# Patient Record
Sex: Male | Born: 1950
Health system: Southern US, Community
[De-identification: ages and names within clinical notes are randomized; demographics above are authoritative.]

## PROBLEM LIST (undated history)

## (undated) DIAGNOSIS — K219 Gastro-esophageal reflux disease without esophagitis: Secondary | ICD-10-CM

## (undated) DIAGNOSIS — I739 Peripheral vascular disease, unspecified: Secondary | ICD-10-CM

## (undated) DIAGNOSIS — I10 Essential (primary) hypertension: Secondary | ICD-10-CM

## (undated) HISTORY — PX: ORIF TIBIA & FIBULA FRACTURES: SHX2131

## (undated) HISTORY — PX: WISDOM TOOTH EXTRACTION: SHX21

---

## 2005-12-12 ENCOUNTER — Ambulatory Visit: Payer: Self-pay | Admitting: Gastroenterology

## 2005-12-12 LAB — HM COLONOSCOPY

## 2012-04-25 ENCOUNTER — Ambulatory Visit: Payer: Self-pay | Admitting: Family Medicine

## 2012-04-30 ENCOUNTER — Ambulatory Visit: Payer: Self-pay | Admitting: Family Medicine

## 2012-05-07 ENCOUNTER — Ambulatory Visit: Payer: Self-pay | Admitting: Family Medicine

## 2012-06-03 ENCOUNTER — Ambulatory Visit: Payer: Self-pay | Admitting: Family Medicine

## 2012-07-11 ENCOUNTER — Ambulatory Visit: Payer: Self-pay | Admitting: Family Medicine

## 2013-02-13 DIAGNOSIS — I82409 Acute embolism and thrombosis of unspecified deep veins of unspecified lower extremity: Secondary | ICD-10-CM

## 2013-02-13 DIAGNOSIS — I739 Peripheral vascular disease, unspecified: Secondary | ICD-10-CM

## 2013-02-13 HISTORY — DX: Acute embolism and thrombosis of unspecified deep veins of unspecified lower extremity: I82.409

## 2013-02-13 HISTORY — DX: Peripheral vascular disease, unspecified: I73.9

## 2013-08-11 LAB — LIPID PANEL
Cholesterol: 199 mg/dL (ref 0–200)
HDL: 43 mg/dL (ref 35–70)
LDL Cholesterol: 123 mg/dL
LDl/HDL Ratio: 2.9
Triglycerides: 163 mg/dL — AB (ref 40–160)

## 2013-08-11 LAB — BASIC METABOLIC PANEL
BUN: 23 mg/dL — AB (ref 4–21)
Creatinine: 1 mg/dL (ref <=1.3)
Glucose: 89 mg/dL
Potassium: 4.6 mmol/L (ref 3.4–5.3)
Sodium: 141 mmol/L (ref 137–147)

## 2013-08-11 LAB — CBC AND DIFFERENTIAL
HCT: 40 % — AB (ref 41–53)
Hemoglobin: 14.1 g/dL (ref 13.5–17.5)
Neutrophils Absolute: 2 /uL
Platelets: 275 10*3/uL (ref 150–399)
WBC: 4.4 10^3/mL

## 2013-08-11 LAB — HEPATIC FUNCTION PANEL
ALT: 17 U/L (ref 10–40)
AST: 22 U/L (ref 14–40)
Alkaline Phosphatase: 108 U/L (ref 25–125)
Bilirubin, Total: 0.5 mg/dL

## 2013-08-11 LAB — PSA: PSA: 0.7

## 2013-08-11 LAB — TSH: TSH: 0.74 u[IU]/mL (ref <=5.90)

## 2014-06-29 DIAGNOSIS — I82409 Acute embolism and thrombosis of unspecified deep veins of unspecified lower extremity: Secondary | ICD-10-CM | POA: Insufficient documentation

## 2014-06-29 DIAGNOSIS — L570 Actinic keratosis: Secondary | ICD-10-CM | POA: Insufficient documentation

## 2014-06-29 DIAGNOSIS — K219 Gastro-esophageal reflux disease without esophagitis: Secondary | ICD-10-CM | POA: Insufficient documentation

## 2014-06-29 DIAGNOSIS — J309 Allergic rhinitis, unspecified: Secondary | ICD-10-CM | POA: Insufficient documentation

## 2014-06-29 DIAGNOSIS — IMO0002 Reserved for concepts with insufficient information to code with codable children: Secondary | ICD-10-CM | POA: Insufficient documentation

## 2014-06-29 DIAGNOSIS — E785 Hyperlipidemia, unspecified: Secondary | ICD-10-CM | POA: Insufficient documentation

## 2014-06-29 DIAGNOSIS — M9979 Connective tissue and disc stenosis of intervertebral foramina of abdomen and other regions: Secondary | ICD-10-CM | POA: Insufficient documentation

## 2014-06-29 DIAGNOSIS — Z86718 Personal history of other venous thrombosis and embolism: Secondary | ICD-10-CM | POA: Insufficient documentation

## 2014-06-29 DIAGNOSIS — I10 Essential (primary) hypertension: Secondary | ICD-10-CM | POA: Insufficient documentation

## 2014-08-26 ENCOUNTER — Encounter: Payer: Self-pay | Admitting: Family Medicine

## 2014-08-26 ENCOUNTER — Ambulatory Visit (INDEPENDENT_AMBULATORY_CARE_PROVIDER_SITE_OTHER): Payer: PRIVATE HEALTH INSURANCE | Admitting: Family Medicine

## 2014-08-26 VITALS — BP 120/78 | HR 64 | Temp 98.5°F | Resp 16 | Ht 72.0 in | Wt 194.8 lb

## 2014-08-26 DIAGNOSIS — Z0000 Encounter for general adult medical examination without abnormal findings: Secondary | ICD-10-CM | POA: Diagnosis not present

## 2014-08-26 LAB — POCT URINALYSIS DIPSTICK
Bilirubin, UA: NEGATIVE
Blood, UA: NEGATIVE
Glucose, UA: NEGATIVE
Ketones, UA: NEGATIVE
Leukocytes, UA: NEGATIVE
Nitrite, UA: NEGATIVE
Protein, UA: NEGATIVE
Spec Grav, UA: 1.02
Urobilinogen, UA: 0.2
pH, UA: 6

## 2014-08-26 LAB — IFOBT (OCCULT BLOOD): IFOBT: NEGATIVE

## 2014-08-26 NOTE — Progress Notes (Signed)
Patient ID: Patrick Long, male   DOB: [birth year], 64 y.o.   MRN: [id number]        Patient: Patrick Long, Male    DOB: 1950/07/26, 64 y.o.   MRN: [id number] Visit Date: 08/26/2014  Today's Provider: Wilhemena Durie, MD   Chief Complaint  Patient presents with  . Annual Exam    last annual exam was on 08/11/2013  Labs were done on 08/11/2013 Subjective:    Annual physical exam Patrick Long is a 64 y.o. male who presents today for health maintenance and complete physical. He feels well. He reports exercising regular activities. He reports he is sleeping well.  -----------------------------------------------------------------   Review of Systems  Constitutional: Negative.   HENT: Negative.   Eyes: Negative.   Respiratory: Negative.   Cardiovascular: Negative.   Gastrointestinal: Negative.   Endocrine: Negative.   Genitourinary: Negative.   Musculoskeletal: Negative.   Skin: Negative.   Allergic/Immunologic: Negative.   Neurological: Negative.   Hematological: Negative.   Psychiatric/Behavioral: Negative.     Social History He  reports that he quit smoking about 34 years ago. He does not have any smokeless tobacco history on file. He reports that he drinks alcohol. He reports that he does not use illicit drugs.  Patient Active Problem List   Diagnosis Date Noted  . Actinic keratoses 06/29/2014  . Allergic rhinitis 06/29/2014  . Narrowing of intervertebral disc space 06/29/2014  . Deep vein thrombosis 06/29/2014  . Essential (primary) hypertension 06/29/2014  . Acid reflux 06/29/2014  . HLD (hyperlipidemia) 06/29/2014  . Squamous cell carcinoma 06/29/2014    No past surgical history on file.  Family History His family history includes Arrhythmia in his mother; Asthma in his mother; Dementia in his mother; Glaucoma in his sister; Prostate cancer in his father; Stroke in his father.    No Known Allergies  Previous Medications   AMOXICILLIN-CLAVULANATE (AUGMENTIN) 875-125 MG PER TABLET    Take 1 tablet by mouth 2 (two) times daily. for 10 days   ASPIRIN 325 MG TABLET    Take by mouth.   ESOMEPRAZOLE (NEXIUM) 40 MG CAPSULE    Take by mouth. Monday Wednesday and Friday   LOSARTAN (COZAAR) 100 MG TABLET    Take by mouth daily.    METRONIDAZOLE (METROGEL) 1 % GEL    METROGEL, 1% (External Gel)  as needed for 0 days  Refills: 0   Ordered :09-Jul-2012  Miguel Aschoff MD;  Started 09-Jul-2012 Active Comments: Medication taken as needed.   MULTIPLE VITAMINS-MINERALS (MULTIVITAMIN ADULT PO)    Take by mouth.    Patient Care Team: Jerrol Banana., MD as PCP - General (Family Medicine)     Objective:   Vitals: BP 120/78 mmHg  Pulse 64  Temp(Src) 98.5 F (36.9 C) (Oral)  Resp 16  Ht 6' (1.829 m)  Wt 194 lb 12.8 oz (88.361 kg)  BMI 26.41 kg/m2   Physical Exam  Constitutional: He appears well-developed and well-nourished.  HENT:  Head: Normocephalic and atraumatic.  Right Ear: External ear normal.  Left Ear: External ear normal.  Nose: Nose normal.  Mouth/Throat: Oropharynx is clear and moist.  Eyes: Conjunctivae and EOM are normal. Pupils are equal, round, and reactive to light.  Neck: Normal range of motion. Neck supple.  Cardiovascular: Normal rate, regular rhythm, normal heart sounds and intact distal pulses.   Pulmonary/Chest: Effort normal and breath sounds normal.  Abdominal: Soft. Bowel sounds are normal.  Genitourinary: Rectum normal, prostate normal and penis  normal.  Musculoskeletal: Normal range of motion.  Neurological: He is alert.  Skin: Skin is warm and dry.  Psychiatric: He has a normal mood and affect. His behavior is normal. Judgment and thought content normal.     Depression Screen No flowsheet data found.    Assessment & Plan:     Routine Health Maintenance and Physical Exam  Exercise Activities and Dietary recommendations Goals    None      Immunization History    Administered Date(s) Administered  . Td 11/03/2003  . Tdap 08/05/2013  . Zoster 08/05/2013    Health Maintenance  Topic Date Due  . HIV Screening  01/09/1965  . INFLUENZA VACCINE  09/14/2014  . COLONOSCOPY  12/13/2015  . TETANUS/TDAP  08/06/2023  . ZOSTAVAX  Completed      Discussed health benefits of physical activity, and encouraged him to engage in regular exercise appropriate for his age and condition.    Hypertension  GERD  History of unprovoked DVT-- this is followed clinically and patient wear support hose.   --------------------------------------------------------------------

## 2014-09-03 ENCOUNTER — Telehealth: Payer: Self-pay

## 2014-09-03 LAB — CBC
Hematocrit: 43.6 % (ref 37.5–51.0)
Hemoglobin: 14.7 g/dL (ref 12.6–17.7)
MCH: 30.4 pg (ref 26.6–33.0)
MCHC: 33.7 g/dL (ref 31.5–35.7)
MCV: 90 fL (ref 79–97)
Platelets: 255 10*3/uL (ref 150–379)
RBC: 4.83 x10E6/uL (ref 4.14–5.80)
RDW: 14 % (ref 12.3–15.4)
WBC: 5.2 10*3/uL (ref 3.4–10.8)

## 2014-09-03 LAB — LIPID PANEL WITH LDL/HDL RATIO
Cholesterol, Total: 233 mg/dL — ABNORMAL HIGH (ref 100–199)
HDL: 31 mg/dL — ABNORMAL LOW (ref >39)
Triglycerides: 568 mg/dL (ref 0–149)

## 2014-09-03 LAB — BASIC METABOLIC PANEL
BUN/Creatinine Ratio: 20 (ref 10–22)
BUN: 18 mg/dL (ref 8–27)
CO2: 21 mmol/L (ref 18–29)
Calcium: 9.4 mg/dL (ref 8.6–10.2)
Chloride: 101 mmol/L (ref 97–108)
Creatinine, Ser: 0.91 mg/dL (ref 0.76–1.27)
GFR calc Af Amer: 103 mL/min/{1.73_m2} (ref >59)
GFR calc non Af Amer: 89 mL/min/{1.73_m2} (ref >59)
Glucose: 95 mg/dL (ref 65–99)
Potassium: 5.1 mmol/L (ref 3.5–5.2)
Sodium: 140 mmol/L (ref 134–144)

## 2014-09-03 LAB — PSA: Prostate Specific Ag, Serum: 0.9 ng/mL (ref 0.0–4.0)

## 2014-09-03 NOTE — Telephone Encounter (Signed)
LMTCB  aa 

## 2014-09-03 NOTE — Telephone Encounter (Signed)
Patient states he is allergic to fish, what should he do? Patrick Long

## 2014-09-03 NOTE — Telephone Encounter (Signed)
-----   Message from Jerrol Banana., MD sent at 09/03/2014  7:17 AM EDT ----- Labs stable except for triglycerides higher. Stressed diet and exercise. Add Fish oil 2 daily.

## 2014-09-03 NOTE — Telephone Encounter (Signed)
Low-fat low-cholesterol diet. Regular exercise helps also.

## 2014-09-04 ENCOUNTER — Telehealth: Payer: Self-pay

## 2014-09-04 NOTE — Telephone Encounter (Signed)
Advised pt of instruction to eat a low cholesterol diet and exercise will help. Pt verbalized fully understanding of instructions.

## 2014-09-07 NOTE — Telephone Encounter (Signed)
LMTCB  aa 

## 2014-11-03 ENCOUNTER — Emergency Department: Payer: 59

## 2014-11-03 ENCOUNTER — Encounter: Payer: Self-pay | Admitting: Emergency Medicine

## 2014-11-03 ENCOUNTER — Telehealth: Payer: Self-pay | Admitting: Family Medicine

## 2014-11-03 ENCOUNTER — Telehealth: Payer: Self-pay | Admitting: Cardiovascular Disease

## 2014-11-03 ENCOUNTER — Emergency Department
Admission: EM | Admit: 2014-11-03 | Discharge: 2014-11-03 | Disposition: A | Discharge status: Home / Self Care | Payer: 59 | Attending: Emergency Medicine | Admitting: Emergency Medicine

## 2014-11-03 DIAGNOSIS — R079 Chest pain, unspecified: Secondary | ICD-10-CM | POA: Diagnosis present

## 2014-11-03 DIAGNOSIS — Z7982 Long term (current) use of aspirin: Secondary | ICD-10-CM | POA: Insufficient documentation

## 2014-11-03 DIAGNOSIS — R1010 Upper abdominal pain, unspecified: Secondary | ICD-10-CM | POA: Insufficient documentation

## 2014-11-03 DIAGNOSIS — I10 Essential (primary) hypertension: Secondary | ICD-10-CM | POA: Insufficient documentation

## 2014-11-03 DIAGNOSIS — Z79899 Other long term (current) drug therapy: Secondary | ICD-10-CM | POA: Insufficient documentation

## 2014-11-03 DIAGNOSIS — Z87891 Personal history of nicotine dependence: Secondary | ICD-10-CM | POA: Insufficient documentation

## 2014-11-03 DIAGNOSIS — R109 Unspecified abdominal pain: Secondary | ICD-10-CM

## 2014-11-03 HISTORY — DX: Peripheral vascular disease, unspecified: I73.9

## 2014-11-03 HISTORY — DX: Essential (primary) hypertension: I10

## 2014-11-03 HISTORY — DX: Gastro-esophageal reflux disease without esophagitis: K21.9

## 2014-11-03 LAB — COMPREHENSIVE METABOLIC PANEL
ALT: 15 U/L — ABNORMAL LOW (ref 17–63)
AST: 24 U/L (ref 15–41)
Albumin: 4.4 g/dL (ref 3.5–5.0)
Alkaline Phosphatase: 98 U/L (ref 38–126)
Anion gap: 6 (ref 5–15)
BUN: 20 mg/dL (ref 6–20)
CO2: 28 mmol/L (ref 22–32)
Calcium: 9.4 mg/dL (ref 8.9–10.3)
Chloride: 105 mmol/L (ref 101–111)
Creatinine, Ser: 1.01 mg/dL (ref 0.61–1.24)
GFR calc Af Amer: >60 mL/min (ref >60)
GFR calc non Af Amer: >60 mL/min (ref >60)
Glucose, Bld: 117 mg/dL — ABNORMAL HIGH (ref 65–99)
Potassium: 4.5 mmol/L (ref 3.5–5.1)
Sodium: 139 mmol/L (ref 135–145)
Total Bilirubin: 0.6 mg/dL (ref 0.3–1.2)
Total Protein: 7.6 g/dL (ref 6.5–8.1)

## 2014-11-03 LAB — CBC WITH DIFFERENTIAL/PLATELET
Basophils Absolute: 0 10*3/uL (ref 0–0.1)
Basophils Relative: 0 %
Eosinophils Absolute: 0.1 10*3/uL (ref 0–0.7)
Eosinophils Relative: 1 %
HCT: 45 % (ref 40.0–52.0)
Hemoglobin: 15.1 g/dL (ref 13.0–18.0)
Lymphocytes Relative: 16 %
Lymphs Abs: 1.2 10*3/uL (ref 1.0–3.6)
MCH: 30.7 pg (ref 26.0–34.0)
MCHC: 33.6 g/dL (ref 32.0–36.0)
MCV: 91.6 fL (ref 80.0–100.0)
Monocytes Absolute: 0.4 10*3/uL (ref 0.2–1.0)
Monocytes Relative: 5 %
Neutro Abs: 5.8 10*3/uL (ref 1.4–6.5)
Neutrophils Relative %: 78 %
Platelets: 249 10*3/uL (ref 150–440)
RBC: 4.92 MIL/uL (ref 4.40–5.90)
RDW: 13.2 % (ref 11.5–14.5)
WBC: 7.5 10*3/uL (ref 3.8–10.6)

## 2014-11-03 LAB — LIPASE, BLOOD: Lipase: 32 U/L (ref 22–51)

## 2014-11-03 LAB — TROPONIN I
Troponin I: <0.03 ng/mL (ref <0.031)
Troponin I: <0.03 ng/mL (ref <0.031)

## 2014-11-03 MED ORDER — GI COCKTAIL ~~LOC~~
30.0000 mL | Freq: Once | ORAL | Status: AC
  Administered 2014-11-03: 30 mL via ORAL
  Filled 2014-11-03: qty 30

## 2014-11-03 NOTE — ED Provider Notes (Addendum)
[facility] Emergency Department Provider Note  ____________________________________________  Time seen: Approximately 7:51 AM  I have reviewed the triage vital signs and the nursing notes.   HISTORY  Chief Complaint Chest Pain    HPI Patrick Long is a 64 y.o. male with a history of hypertension and GERD who is presenting today with upper abdominal tightness associated with nausea and one episode of vomiting. He said that he [place] this [place] when he felt tightness around his upper abdomen and into his lower chest. He said that he then became diaphoretic and one episode of vomiting. He said that the pain has decreased since the incident. He is also short of breath at the time. No longer feels nauseous but still is mild pain to the upper abdomen. Denies any chest pain at this time. No pain with deep breathing. Said he did have a superficial venous thrombosis about one year ago which he took aspirin for treatment.Does not note any recent leg swelling or pain. Denies any recent pain after eating. No heart disease in his family but does have a history of stroke and his family.   Past Medical History  Diagnosis Date  . Hypertension   . GERD (gastroesophageal reflux disease)   . Peripheral vascular disease     Patient Active Problem List   Diagnosis Date Noted  . Actinic keratoses 06/29/2014  . Allergic rhinitis 06/29/2014  . Narrowing of intervertebral disc space 06/29/2014  . Deep vein thrombosis 06/29/2014  . Essential (primary) hypertension 06/29/2014  . Acid reflux 06/29/2014  . HLD (hyperlipidemia) 06/29/2014  . Squamous cell carcinoma 06/29/2014    Past Surgical History  Procedure Laterality Date  . Orif tibia & fibula fractures      Current Outpatient Rx  Name  Route  Sig  Dispense  Refill  . aspirin 325 MG tablet   Oral   Take 325 mg by mouth daily.          Marland Kitchen esomeprazole (NEXIUM) 40 MG capsule   Oral   Take 40 mg by mouth  every other day.          . losartan (COZAAR) 100 MG tablet   Oral   Take 100 mg by mouth daily.          . Multiple Vitamins-Minerals (MULTIVITAMIN ADULT PO)   Oral   Take 1 tablet by mouth daily.            Allergies Fish allergy  Family History  Problem Relation Age of Onset  . Asthma Mother   . Arrhythmia Mother   . Dementia Mother   . Prostate cancer Father   . Stroke Father   . Glaucoma Sister     Social History Social History  Substance Use Topics  . Smoking status: Former Smoker -- 10 years    Quit date: 02/14/1980  . Smokeless tobacco: None  . Alcohol Use: 0.0 oz/week    0 Standard drinks or equivalent per week     Comment: occasionally    Review of Systems Constitutional: No fever/chills Eyes: No visual changes. ENT: No sore throat. Cardiovascular: As above  Respiratory: As above Gastrointestinal:  No diarrhea.  No constipation. Genitourinary: Negative for dysuria. Musculoskeletal: Negative for back pain. Skin: Negative for rash. Neurological: Negative for headaches, focal weakness or numbness.  10-point ROS otherwise negative.  ____________________________________________   PHYSICAL EXAM:  VITAL SIGNS: ED Triage Vitals  Enc Vitals Group     BP 11/03/14 0747  179/117 mmHg     Pulse Rate 11/03/14 0747 58     Resp 11/03/14 0747 17     Temp 11/03/14 0747 97.7 F (36.5 C)     Temp Source 11/03/14 0747 Oral     SpO2 11/03/14 0747 100 %     Weight 11/03/14 0747 190 lb (86.183 kg)     Height 11/03/14 0747 6' (1.829 m)     Head Cir --      Peak Flow --      Pain Score 11/03/14 0748 5     Pain Loc --      Pain Edu? --      Excl. in [place]? --     Constitutional: Alert and oriented. Well appearing and in no acute distress. Eyes: Conjunctivae are normal. PERRL. EOMI. Head: Atraumatic. Nose: No congestion/rhinnorhea. Mouth/Throat: Mucous membranes are moist.  Oropharynx non-erythematous. Neck: No stridor.   Cardiovascular: Normal rate,  regular rhythm. Grossly normal heart sounds.  Good peripheral circulation. Respiratory: Normal respiratory effort.  No retractions. Lungs CTAB. Gastrointestinal: Soft with mild to moderate tenderness to the upper abdomen including a positive Murphy sign.. No distention. No abdominal bruits. No CVA tenderness. Musculoskeletal: No lower extremity tenderness nor edema.  No joint effusions. Neurologic:  Normal speech and language. No gross focal neurologic deficits are appreciated. No gait instability. Skin:  Skin is warm, dry and intact. No rash noted. Psychiatric: Mood and affect are normal. Speech and behavior are normal.  ____________________________________________   LABS (all labs ordered are listed, but only abnormal results are displayed)  Labs Reviewed  COMPREHENSIVE METABOLIC PANEL - Abnormal; Notable for the following:    Glucose, Bld 117 (*)    ALT 15 (*)    All other components within normal limits  CBC WITH DIFFERENTIAL/PLATELET  TROPONIN I  LIPASE, BLOOD  TROPONIN I   ____________________________________________  EKG  ED ECG REPORT I, Doran Stabler, the attending physician, personally viewed and interpreted this ECG.   Date: 11/03/2014  EKG Time: 744  Rate: 63  Rhythm: normal EKG, normal sinus rhythm with sinus arrhythmia  Axis: Normal axis  Intervals:none  ST&T Change: No ST segment elevations or depressions. No abnormal T-wave inversions.  ____________________________________________  RADIOLOGY  Normal chest x-ray. I personally reviewed this image.  Normal gallbladder ultrasound. ____________________________________________   PROCEDURES    ____________________________________________   INITIAL IMPRESSION / ASSESSMENT AND PLAN / ED COURSE  Pertinent labs & imaging results that were available during my care of the patient were reviewed by me and considered in my medical decision making (see chart for  details).  ----------------------------------------- 12:25 PM on 11/03/2014 -----------------------------------------  Pain relieved after GI cocktail. Suspect upper abdominal pain secondary to gastric issue with vagal response causing diaphoresis. However, patient does have cardiac risk factors. Discussed case with Dr. Rockey Situ and will see patient in the office. For the patient's contact information to his office staff to be contacted for an urgent follow-up appointment. I discussed the results with the patient as well as his family. He will begin taking his PPI every day instead of every other day. ____________________________________________   FINAL CLINICAL IMPRESSION(S) / ED DIAGNOSES  Acute abdominal pain. Acute chest pain. Initial visit.    Orbie Pyo, MD 11/03/14 1227  Patient takes a daily 325 mg aspirin and will continue to do so. He took this this morning before arrival.  Orbie Pyo, MD 11/03/14 1228

## 2014-11-03 NOTE — Telephone Encounter (Signed)
[id number] 

## 2014-11-03 NOTE — Telephone Encounter (Signed)
Pt is returning call.  MB#[phone]

## 2014-11-03 NOTE — ED Notes (Signed)
Pt states GI cocktail initially made him slightly nauseated but now reports abdominal pain is absent.

## 2014-11-03 NOTE — Telephone Encounter (Signed)
Patient was seen in the emergency room for chest discomfort. Cardiac enzymes negative, other workup unrevealing, will be discharged today.  Need to arrange follow-up in the cardiology clinic [place] send a message to scheduling and will call him at home to schedule office visit

## 2014-11-03 NOTE — Telephone Encounter (Signed)
Pt wanted to let you know he went to the ER this morning thinking maybe he was having a heart attack.  He said they did all the test and ruled that out.  They referred him to [facility].  He wants to talk to you before he goes to see him.  [place] is suppose to call him back with an appt.  Call back is  [phone]  Thanks Con Memos

## 2014-11-03 NOTE — Discharge Instructions (Signed)
Abdominal Pain °Many things can cause abdominal pain. Usually, abdominal pain is not caused by a disease and will improve without treatment. It can often be observed and treated at home. Your health care provider will do a physical exam and possibly order blood tests and X-rays to help determine the seriousness of your pain. However, in many cases, more time must pass before a clear cause of the pain can be found. Before that point, your health care provider may not know if you need more testing or further treatment. °HOME CARE INSTRUCTIONS  °Monitor your abdominal pain for any changes. The following actions may help to alleviate any discomfort you are experiencing: °· Only take over-the-counter or prescription medicines as directed by your health care provider. °· Do not take laxatives unless directed to do so by your health care provider. °· Try a clear liquid diet (broth, tea, or water) as directed by your health care provider. Slowly move to a bland diet as tolerated. °SEEK MEDICAL CARE IF: °· You have unexplained abdominal pain. °· You have abdominal pain associated with nausea or diarrhea. °· You have pain when you urinate or have a bowel movement. °· You experience abdominal pain that wakes you in the night. °· You have abdominal pain that is worsened or improved by eating food. °· You have abdominal pain that is worsened with eating fatty foods. °· You have a fever. °SEEK IMMEDIATE MEDICAL CARE IF:  °· Your pain does not go away within 2 hours. °· You keep throwing up (vomiting). °· Your pain is felt only in portions of the abdomen, such as the right side or the left lower portion of the abdomen. °· You pass bloody or black tarry stools. °MAKE SURE YOU: °· Understand these instructions.   °· Will watch your condition.   °· Will get help right away if you are not doing well or get worse.   °Document Released: 11/09/2004 Document Revised: 02/04/2013 Document Reviewed: 10/09/2012 °ExitCare® Patient Information  ©2015 [employer]. This information is not intended to replace advice given to you by your health care provider. Make sure you discuss any questions you have with your health care provider. ° °Chest Pain (Nonspecific) °It is often hard to give a diagnosis for the cause of chest pain. There is always a chance that your pain could be related to something serious, such as a heart attack or a blood clot in the lungs. You need to follow up with your doctor. °HOME CARE °· If antibiotic medicine was given, take it as directed by your doctor. Finish the medicine even if you start to feel better. °· For the next few days, avoid activities that bring on chest pain. Continue physical activities as told by your doctor. °· Do not use any tobacco products. This includes cigarettes, chewing tobacco, and e-cigarettes. °· Avoid drinking alcohol. °· Only take medicine as told by your doctor. °· Follow your doctor's suggestions for more testing if your chest pain does not go away. °· Keep all doctor visits you made. °GET HELP IF: °· Your chest pain does not go away, even after treatment. °· You have a rash with blisters on your chest. °· You have a fever. °GET HELP RIGHT AWAY IF:  °· You have more pain or pain that spreads to your arm, neck, jaw, back, or belly (abdomen). °· You have shortness of breath. °· You cough more than usual or cough up blood. °· You have very bad back or belly pain. °· You feel sick   to your stomach (nauseous) or throw up (vomit). °· You have very bad weakness. °· You pass out (faint). °· You have chills. °This is an emergency. Do not wait to see if the problems will go away. Call your local emergency services (911 in [place] Do not drive yourself to the hospital. °MAKE SURE YOU:  °· Understand these instructions. °· Will watch your condition. °· Will get help right away if you are not doing well or get worse. °Document Released: 07/19/2007 Document Revised: 02/04/2013 Document Reviewed: 07/19/2007 °ExitCare®  Patient Information ©2015 [employer]. This information is not intended to replace advice given to you by your health care provider. Make sure you discuss any questions you have with your health care provider. ° °

## 2014-11-03 NOTE — ED Notes (Signed)
Pt resents to ED with lower epigastric and abdominal pain. Pt reports diaphoresis, nausea and vomiting, and shortness of breath.

## 2014-11-04 ENCOUNTER — Other Ambulatory Visit: Payer: Self-pay | Admitting: Family Medicine

## 2014-11-04 ENCOUNTER — Telehealth: Payer: Self-pay

## 2014-11-04 DIAGNOSIS — R0789 Other chest pain: Secondary | ICD-10-CM

## 2014-11-04 NOTE — Telephone Encounter (Signed)
Patient reports that he just called to get an appt with Dr. Donivan Scull office for a cardiology evaluation (patient went to the ER yesterday due to chest pain and shortness of breath) and he reports that they are not able to see him until the 1st week of November. Patient wanted to know if there was another doctor you recommend that could possibly get him in sooner? Patient does not want to wait that long to be seen. Please advise. Contact number is [phone].  Thanks!

## 2014-11-04 NOTE — Telephone Encounter (Signed)
Returned patient's call. Advised patient that Dr. Rosanna Randy does recommend Dr. Rockey Situ as a cardiologist.

## 2014-11-04 NOTE — Telephone Encounter (Signed)
Pt returned your call.  Please call [phone]

## 2014-11-04 NOTE — Telephone Encounter (Signed)
Patrick Long or Patrick Long would see him sooner.

## 2014-11-04 NOTE — Telephone Encounter (Signed)
Pt advised please refer order put in-aa please see below

## 2014-11-27 ENCOUNTER — Other Ambulatory Visit: Payer: Self-pay | Admitting: Family Medicine

## 2014-12-21 ENCOUNTER — Ambulatory Visit: Payer: 59 | Admitting: Cardiovascular Disease

## 2015-01-28 ENCOUNTER — Other Ambulatory Visit: Payer: Self-pay | Admitting: Family Medicine

## 2015-02-25 ENCOUNTER — Ambulatory Visit (INDEPENDENT_AMBULATORY_CARE_PROVIDER_SITE_OTHER): Payer: PRIVATE HEALTH INSURANCE | Admitting: Family Medicine

## 2015-02-25 ENCOUNTER — Encounter: Payer: Self-pay | Admitting: Family Medicine

## 2015-02-25 VITALS — BP 138/90 | HR 72 | Temp 98.1°F | Resp 16 | Wt 197.0 lb

## 2015-02-25 DIAGNOSIS — I10 Essential (primary) hypertension: Secondary | ICD-10-CM | POA: Diagnosis not present

## 2015-02-25 DIAGNOSIS — E781 Pure hyperglyceridemia: Secondary | ICD-10-CM | POA: Diagnosis not present

## 2015-02-25 DIAGNOSIS — E785 Hyperlipidemia, unspecified: Secondary | ICD-10-CM

## 2015-02-25 DIAGNOSIS — K219 Gastro-esophageal reflux disease without esophagitis: Secondary | ICD-10-CM | POA: Diagnosis not present

## 2015-02-25 NOTE — Progress Notes (Signed)
Patient ID: Patrick Long, male   DOB: 1950/03/04, 65 y.o.   MRN: [id number]    Subjective:  HPI  Patient is here for 6 months follow up. Hypertension: Patient is checking his b/p and readings have been around 130s-120s/80s usually. No cardiac symptoms. BP Readings from Last 3 Encounters:  02/25/15 138/90  11/03/14 129/85  08/26/14 120/78   GERD: Patient is doing well on Nexium every other day.  Patient was seen in ER for chest pain in September and then saw Dr. Ubaldo Glassing in September and he had stress test and EKG done. No changes were made. They did discuss his triglycerides levels and Dr. Ubaldo Glassing thought that was unusual that patient has these elevated levels when we checked in July.  Prior to Admission medications   Medication Sig Start Date End Date Taking? Authorizing Provider  aspirin 81 MG tablet Take 81 mg by mouth daily.   Yes Historical Provider, MD  losartan (COZAAR) 100 MG tablet take 1 tablet by mouth once daily 11/27/14  Yes Verdis Bassette Maceo Pro., MD  Multiple Vitamins-Minerals (MULTIVITAMIN ADULT PO) Take 1 tablet by mouth daily.    Yes Historical Provider, MD  NEXIUM 40 MG capsule take 1 capsule by mouth every other day 01/28/15  Yes Anchor Dwan Maceo Pro., MD    Patient Active Problem List   Diagnosis Date Noted  . Actinic keratoses 06/29/2014  . Allergic rhinitis 06/29/2014  . Narrowing of intervertebral disc space 06/29/2014  . Deep vein thrombosis ([place]) 06/29/2014  . Essential (primary) hypertension 06/29/2014  . Acid reflux 06/29/2014  . HLD (hyperlipidemia) 06/29/2014  . Squamous cell carcinoma ([place]) 06/29/2014    Past Medical History  Diagnosis Date  . Hypertension   . GERD (gastroesophageal reflux disease)   . Peripheral vascular disease [facility])     Social History   Social History  . Marital Status: Married    Spouse Name: N/A  . Number of Children: N/A  . Years of Education: N/A   Occupational History  . Not on file.   Social History Main Topics   . Smoking status: Former Smoker -- 10 years    Types: Cigarettes    Quit date: 02/14/1980  . Smokeless tobacco: Never Used  . Alcohol Use: 0.0 oz/week    0 Standard drinks or equivalent per week     Comment: occasionally  . Drug Use: No  . Sexual Activity: Not on file   Other Topics Concern  . Not on file   Social History Narrative    Allergies  Allergen Reactions  . Fish Allergy Nausea And Vomiting    Can not eat any fish with bones    Review of Systems  Constitutional: Negative.   Respiratory: Negative.   Cardiovascular: Negative.   Gastrointestinal: Negative.   Musculoskeletal: Negative.     Immunization History  Administered Date(s) Administered  . Td 11/03/2003  . Tdap 08/05/2013  . Zoster 08/05/2013   Objective:  BP 138/90 mmHg  Pulse 72  Temp(Src) 98.1 F (36.7 C)  Resp 16  Wt 197 lb (89.359 kg)  Physical Exam  Constitutional: He is oriented to person, place, and time and well-developed, well-nourished, and in no distress.  HENT:  Head: Normocephalic and atraumatic.  Right Ear: External ear normal.  Left Ear: External ear normal.  Eyes: Conjunctivae are normal. Pupils are equal, round, and reactive to light.  Neck: Normal range of motion. Neck supple.  Cardiovascular: Normal rate, regular rhythm, normal heart sounds and intact distal pulses.  No murmur heard. Pulmonary/Chest: Effort normal and breath sounds normal. No respiratory distress. He has no wheezes.  Musculoskeletal: Normal range of motion. He exhibits no edema or tenderness.  Neurological: He is alert and oriented to person, place, and time. Gait normal.  Psychiatric: Mood, memory, affect and judgment normal.    Lab Results  Component Value Date   WBC 7.5 11/03/2014   HGB 15.1 11/03/2014   HCT 45.0 11/03/2014   PLT 249 11/03/2014   GLUCOSE 117* 11/03/2014   CHOL 233* 09/02/2014   TRIG 568* 09/02/2014   HDL 31* 09/02/2014   LDLCALC Comment 09/02/2014   TSH 0.74 08/11/2013    PSA 0.7 08/11/2013    CMP     Component Value Date/Time   NA 139 11/03/2014 0812   NA 140 09/02/2014 0817   K 4.5 11/03/2014 0812   CL 105 11/03/2014 0812   CO2 28 11/03/2014 0812   GLUCOSE 117* 11/03/2014 0812   GLUCOSE 95 09/02/2014 0817   BUN 20 11/03/2014 0812   BUN 18 09/02/2014 0817   CREATININE 1.01 11/03/2014 0812   CREATININE 1.0 08/11/2013   CALCIUM 9.4 11/03/2014 0812   PROT 7.6 11/03/2014 0812   ALBUMIN 4.4 11/03/2014 0812   AST 24 11/03/2014 0812   ALT 15* 11/03/2014 0812   ALKPHOS 98 11/03/2014 0812   BILITOT 0.6 11/03/2014 0812   GFRNONAA >60 11/03/2014 0812   GFRAA >60 11/03/2014 0812    Assessment and Plan :  1. Essential (primary) hypertension Stable.  2. Gastro-esophageal reflux disease without esophagitis Stable.  3. HLD (hyperlipidemia) Elevated triglicerydes on last check will re check today. May need to refer to a lipid specialist. - Lipid Panel With LDL/HDL Ratio 4. History of DVT I have done the exam and reviewed the above chart and it is accurate to the best of my knowledge.  Miguel Aschoff MD [facility] 02/25/2015 4:27 PM

## 2015-03-04 LAB — LIPID PANEL WITH LDL/HDL RATIO
Cholesterol, Total: 229 mg/dL — ABNORMAL HIGH (ref 100–199)
HDL: 47 mg/dL (ref >39)
LDL Calculated: 144 mg/dL — ABNORMAL HIGH (ref 0–99)
LDl/HDL Ratio: 3.1 ratio units (ref 0.0–3.6)
Triglycerides: 192 mg/dL — ABNORMAL HIGH (ref 0–149)
VLDL Cholesterol Cal: 38 mg/dL (ref 5–40)

## 2015-03-05 ENCOUNTER — Telehealth: Payer: Self-pay

## 2015-03-05 NOTE — Telephone Encounter (Signed)
-----   Message from Jerrol Banana., MD sent at 03/05/2015  9:42 AM EST ----- Better--continue present therapy

## 2015-03-05 NOTE — Telephone Encounter (Signed)
LMTCB

## 2015-03-08 NOTE — Telephone Encounter (Signed)
Pt advised.   Thanks,   -Tyrese Ficek  

## 2015-09-16 ENCOUNTER — Encounter: Payer: PRIVATE HEALTH INSURANCE | Admitting: Family Medicine

## 2015-09-22 ENCOUNTER — Ambulatory Visit (INDEPENDENT_AMBULATORY_CARE_PROVIDER_SITE_OTHER): Payer: Commercial Managed Care - PPO | Admitting: Family Medicine

## 2015-09-22 ENCOUNTER — Encounter: Payer: Self-pay | Admitting: Family Medicine

## 2015-09-22 VITALS — BP 136/82 | HR 68 | Temp 97.9°F | Resp 16 | Ht 71.5 in | Wt 195.0 lb

## 2015-09-22 DIAGNOSIS — Z1211 Encounter for screening for malignant neoplasm of colon: Secondary | ICD-10-CM | POA: Insufficient documentation

## 2015-09-22 DIAGNOSIS — E785 Hyperlipidemia, unspecified: Secondary | ICD-10-CM | POA: Diagnosis not present

## 2015-09-22 DIAGNOSIS — Z125 Encounter for screening for malignant neoplasm of prostate: Secondary | ICD-10-CM | POA: Diagnosis not present

## 2015-09-22 DIAGNOSIS — Z0000 Encounter for general adult medical examination without abnormal findings: Secondary | ICD-10-CM

## 2015-09-22 DIAGNOSIS — Z23 Encounter for immunization: Secondary | ICD-10-CM

## 2015-09-22 DIAGNOSIS — Z1159 Encounter for screening for other viral diseases: Secondary | ICD-10-CM

## 2015-09-22 DIAGNOSIS — I10 Essential (primary) hypertension: Secondary | ICD-10-CM

## 2015-09-22 LAB — POCT URINALYSIS DIPSTICK
Bilirubin, UA: NEGATIVE
Blood, UA: NEGATIVE
Glucose, UA: NEGATIVE
Ketones, UA: NEGATIVE
Leukocytes, UA: NEGATIVE
Nitrite, UA: NEGATIVE
Protein, UA: NEGATIVE
Spec Grav, UA: <=1.005
Urobilinogen, UA: 0.2
pH, UA: 7.5

## 2015-09-22 NOTE — Progress Notes (Signed)
Patient: Patrick Long, Male    DOB: Jan 01, 1951, 65 y.o.   MRN: [id number] Visit Date: 09/22/2015  Today's Provider: Wilhemena Durie, MD   Chief Complaint  Patient presents with  . Annual Exam    Pt has private insurance   Subjective:    Annual Physical Exam Patrick Long is a 65 y.o. male. He feels well. He reports exercising while [place] at [place]. He reports he is sleeping well.  Last colonoscopy- 12/12/2005- polyps Tdap- 08/05/2013 Zoster- 08/05/2013  -----------------------------------------------------------   Review of Systems  Constitutional: Negative.   HENT: Negative.   Eyes: Negative.   Respiratory: Negative.   Cardiovascular: Positive for leg swelling (H/O DVT).  Endocrine: Negative.   Musculoskeletal: Positive for arthralgias, back pain and neck stiffness.  Allergic/Immunologic: Negative.   Neurological: Negative.        He gets occasional numbness of the right thumb.  Hematological: Negative.   Psychiatric/Behavioral: Negative.   All other systems reviewed and are negative.   Social History   Social History  . Marital status: Married    Spouse name: Dorinda  . Number of children: 1  . Years of education: 41   Occupational History  . [place] History Main Topics  . Smoking status: Former Smoker    Years: 10.00    Types: Cigarettes    Quit date: 02/14/1980  . Smokeless tobacco: Never Used  . Alcohol use 0.0 oz/week     Comment: occasionally  . Drug use: No  . Sexual activity: Yes   Other Topics Concern  . Not on file   Social History Narrative  . No narrative on file    Past Medical History:  Diagnosis Date  . GERD (gastroesophageal reflux disease)   . Hypertension   . Peripheral vascular disease [facility])      Patient Active Problem List   Diagnosis Date Noted  . Actinic keratoses 06/29/2014  . Allergic rhinitis 06/29/2014  . Narrowing of intervertebral disc space 06/29/2014  . Deep  vein thrombosis ([place]) 06/29/2014  . Essential (primary) hypertension 06/29/2014  . Acid reflux 06/29/2014  . HLD (hyperlipidemia) 06/29/2014  . Squamous cell carcinoma ([place]) 06/29/2014  . Gastro-esophageal reflux disease without esophagitis 06/29/2014  . Connective tissue and disc stenosis of intervertebral foramina of abdomen and other regions 06/29/2014    Past Surgical History:  Procedure Laterality Date  . ORIF TIBIA & FIBULA FRACTURES      His family history includes Arrhythmia in his mother; Asthma in his mother; Dementia in his mother; Glaucoma in his sister; Prostate cancer in his father; Stroke in his father.    Current Meds  Medication Sig  . aspirin 81 MG tablet Take 81 mg by mouth daily.  Marland Kitchen losartan (COZAAR) 100 MG tablet take 1 tablet by mouth once daily  . Multiple Vitamins-Minerals (MULTIVITAMIN ADULT PO) Take 1 tablet by mouth daily.   Marland Kitchen NEXIUM 40 MG capsule take 1 capsule by mouth every other day    Patient Care Team: Jerrol Banana., MD as PCP - General (Family Medicine)    Objective:   Vitals: BP 136/82 (BP Location: Right Arm, Patient Position: Sitting, Cuff Size: Large)   Pulse 68   Temp 97.9 F (36.6 C) (Oral)   Resp 16   Ht 5' 11.5" (1.816 m)   Wt 195 lb (88.5 kg)   BMI 26.82 kg/m   Physical Exam  Constitutional: He is oriented  to person, place, and time. He appears well-developed and well-nourished. No distress.  HENT:  Head: Normocephalic and atraumatic.  Right Ear: External ear normal.  Left Ear: External ear normal.  Nose: Nose normal.  Mouth/Throat: Oropharynx is clear and moist. No oropharyngeal exudate.  Eyes: Conjunctivae and EOM are normal. Pupils are equal, round, and reactive to light. Right eye exhibits no discharge. Left eye exhibits no discharge. No scleral icterus.  Neck: Normal range of motion. Neck supple. No tracheal deviation present. No thyromegaly present.  Cardiovascular: Normal rate, regular rhythm and normal heart  sounds.   Pulmonary/Chest: Effort normal and breath sounds normal. No respiratory distress. He has no wheezes. He exhibits no tenderness.  Abdominal: Soft. Bowel sounds are normal. He exhibits no distension. There is no tenderness.  Musculoskeletal: Normal range of motion. He exhibits edema.  No edema of the left lower leg. 1+ edema the right lower leg from previous DVT  Lymphadenopathy:    He has no cervical adenopathy.  Neurological: He is alert and oriented to person, place, and time. He has normal reflexes. He displays normal reflexes. No cranial nerve deficit. He exhibits normal muscle tone. Coordination normal.  Skin: Skin is warm and dry. No rash noted. He is not diaphoretic. No erythema. No pallor.  Psychiatric: He has a normal mood and affect. His behavior is normal. Judgment and thought content normal.    Activities of Daily Living In your present state of health, do you have any difficulty performing the following activities: 02/25/2015  Hearing? N  Vision? N  Difficulty concentrating or making decisions? N  Walking or climbing stairs? N  Dressing or bathing? N  Doing errands, shopping? N  Some recent data might be hidden    Fall Risk Assessment Fall Risk  09/22/2015 02/25/2015  Falls in the past year? No No     Depression Screen PHQ 2/9 Scores 09/22/2015 02/25/2015  PHQ - 2 Score 0 0       Assessment & Plan:     Annual Physical Exam  Reviewed patient's Family Medical History Reviewed and updated list of patient's medical providers Assessment of cognitive impairment was done Assessed patient's functional ability Established a written schedule for health screening [place] Completed and Reviewed  Exercise Activities and Dietary recommendations Goals    None      Immunization History  Administered Date(s) Administered  . Td 11/03/2003  . Tdap 08/05/2013  . Zoster 08/05/2013    Health Maintenance  Topic Date Due  . Hepatitis C  Screening  02-18-1950  . HIV Screening  01/09/1965  . PNA vac Low Risk Adult (1 of 2 - PCV13) 01/10/2015  . INFLUENZA VACCINE  02/25/2016 (Originally 09/14/2015)  . COLONOSCOPY  12/13/2015  . TETANUS/TDAP  08/06/2023  . ZOSTAVAX  Completed      Discussed health benefits of physical activity, and encouraged him to engage in regular exercise appropriate for his age and condition.    ------------------------------------------------------------------------------------------------------------ 1. Annual physical exam Stable. - POCT urinalysis dipstick Results for orders placed or performed in visit on 09/22/15  POCT urinalysis dipstick  Result Value Ref Range   Color, UA yellow    Clarity, UA clear    Glucose, UA Negative    Bilirubin, UA Negative    Ketones, UA Negative    Spec Grav, UA <=1.005    Blood, UA Negative    pH, UA 7.5    Protein, UA Negative    Urobilinogen, UA 0.2  Nitrite, UA Negative    Leukocytes, UA Negative Negative   Refer to GI. Last colonoscopy 12/12/2005.  2. Need for pneumococcal vaccination Administered today. - Pneumococcal conjugate vaccine 13-valent IM  3. Essential (primary) hypertension Stable. Check labs and FU pending results. - CBC with Differential/Platelet - Comprehensive metabolic panel  4. HLD (hyperlipidemia) Check labs and FU pending results. - Lipid panel - TSH  5. Prostate cancer screening Check labs and FU pending results. - PSA  6. Colon cancer screening Refer for colonoscopy as below. - Ambulatory referral to Gastroenterology  7. Need for hepatitis C screening test Check labs and FU pending results. - Hepatitis C antibody  8. History of unprovoked DVT Patient seen and examined by Miguel Aschoff, MD, and note scribed by Renaldo Fiddler, CMA.   Phylicia Mcgaugh Cranford Mon, MD  [facility]

## 2015-09-24 LAB — LIPID PANEL
Chol/HDL Ratio: 6.5 ratio units — ABNORMAL HIGH (ref 0.0–5.0)
Cholesterol, Total: 229 mg/dL — ABNORMAL HIGH (ref 100–199)
HDL: 35 mg/dL — ABNORMAL LOW (ref >39)
Triglycerides: 443 mg/dL — ABNORMAL HIGH (ref 0–149)

## 2015-09-24 LAB — COMPREHENSIVE METABOLIC PANEL
ALT: 14 IU/L (ref 0–44)
AST: 13 IU/L (ref 0–40)
Albumin/Globulin Ratio: 1.4 (ref 1.2–2.2)
Albumin: 3.9 g/dL (ref 3.6–4.8)
Alkaline Phosphatase: 104 IU/L (ref 39–117)
BUN/Creatinine Ratio: 16 (ref 10–24)
BUN: 15 mg/dL (ref 8–27)
Bilirubin Total: 0.3 mg/dL (ref 0.0–1.2)
CO2: 25 mmol/L (ref 18–29)
Calcium: 9.2 mg/dL (ref 8.6–10.2)
Chloride: 103 mmol/L (ref 96–106)
Creatinine, Ser: 0.94 mg/dL (ref 0.76–1.27)
GFR calc Af Amer: 98 mL/min/{1.73_m2} (ref >59)
GFR calc non Af Amer: 85 mL/min/{1.73_m2} (ref >59)
Globulin, Total: 2.7 g/dL (ref 1.5–4.5)
Glucose: 101 mg/dL — ABNORMAL HIGH (ref 65–99)
Potassium: 4.8 mmol/L (ref 3.5–5.2)
Sodium: 141 mmol/L (ref 134–144)
Total Protein: 6.6 g/dL (ref 6.0–8.5)

## 2015-09-24 LAB — CBC WITH DIFFERENTIAL/PLATELET
Basophils Absolute: 0 10*3/uL (ref 0.0–0.2)
Basos: 0 %
EOS (ABSOLUTE): 0.1 10*3/uL (ref 0.0–0.4)
Eos: 2 %
Hematocrit: 44.7 % (ref 37.5–51.0)
Hemoglobin: 15.2 g/dL (ref 12.6–17.7)
Immature Grans (Abs): 0 10*3/uL (ref 0.0–0.1)
Immature Granulocytes: 0 %
Lymphocytes Absolute: 2.7 10*3/uL (ref 0.7–3.1)
Lymphs: 31 %
MCH: 31.7 pg (ref 26.6–33.0)
MCHC: 34 g/dL (ref 31.5–35.7)
MCV: 93 fL (ref 79–97)
Monocytes Absolute: 0.6 10*3/uL (ref 0.1–0.9)
Monocytes: 6 %
Neutrophils Absolute: 5.4 10*3/uL (ref 1.4–7.0)
Neutrophils: 61 %
Platelets: 249 10*3/uL (ref 150–379)
RBC: 4.8 x10E6/uL (ref 4.14–5.80)
RDW: 13.4 % (ref 12.3–15.4)
WBC: 8.8 10*3/uL (ref 3.4–10.8)

## 2015-09-24 LAB — TSH: TSH: 1.3 u[IU]/mL (ref 0.450–4.500)

## 2015-09-24 LAB — PSA: Prostate Specific Ag, Serum: 0.8 ng/mL (ref 0.0–4.0)

## 2015-09-24 LAB — HEPATITIS C ANTIBODY: Hep C Virus Ab: <0.1 s/co ratio (ref 0.0–0.9)

## 2015-09-28 ENCOUNTER — Telehealth: Payer: Self-pay

## 2015-09-28 NOTE — Telephone Encounter (Signed)
  LMTCB 09/28/2015  Thanks,   -Mickel Baas

## 2015-09-28 NOTE — Telephone Encounter (Signed)
-----   Message from Jerrol Banana., MD sent at 09/24/2015  4:00 PM EDT ----- Labs ok--work on diet due to high triglycerides--fewer carbs/sugars.

## 2015-09-30 NOTE — Telephone Encounter (Signed)
Patient advised as below. Patient verbalizes understanding and is in agreement with treatment plan.  

## 2015-10-11 ENCOUNTER — Other Ambulatory Visit: Payer: Self-pay

## 2015-10-11 ENCOUNTER — Telehealth: Payer: Self-pay | Admitting: Gastroenterology

## 2015-10-11 NOTE — Telephone Encounter (Signed)
PATIENT IS RETURNING YOU CALL

## 2015-10-11 NOTE — Telephone Encounter (Signed)
Gastroenterology Pre-Procedure Review  Request Date: 11/11/2015 Requesting Physician: Dr. Rosanna Randy   PATIENT REVIEW QUESTIONS: The patient responded to the following health history questions as indicated:    1. Are you having any GI issues? no 2. Do you have a personal history of Polyps? no 3. Do you have a family history of Colon Cancer or Polyps? no 4. Diabetes Mellitus? no 5. Joint replacements in the past 12 months?no 6. Major health problems in the past 3 months?no 7. Any artificial heart valves, MVP, or defibrillator?no    MEDICATIONS & ALLERGIES:    Patient reports the following regarding taking any anticoagulation/antiplatelet therapy:   Plavix, Coumadin, Eliquis, Xarelto, Lovenox, Pradaxa, Brilinta, or Effient? no Aspirin? yes (blood thinner)  Patient confirms/reports the following medications:  Current Outpatient Prescriptions  Medication Sig Dispense Refill  . aspirin 81 MG tablet Take 81 mg by mouth daily.    Marland Kitchen losartan (COZAAR) 100 MG tablet take 1 tablet by mouth once daily 30 tablet 12  . Multiple Vitamins-Minerals (MULTIVITAMIN ADULT PO) Take 1 tablet by mouth daily.     Marland Kitchen NEXIUM 40 MG capsule take 1 capsule by mouth every other day 30 capsule 12   No current facility-administered medications for this visit.     Patient confirms/reports the following allergies:  Allergies  Allergen Reactions  . Fish Allergy Nausea And Vomiting    Can not eat any fish with bones    No orders of the defined types were placed in this encounter.   AUTHORIZATION INFORMATION Primary Insurance: 1D#: Group #:  Secondary Insurance: 1D#: Group #:  SCHEDULE INFORMATION: Date: 11/11/2015 Time: Location: [facility]

## 2015-10-11 NOTE — Telephone Encounter (Signed)
Screening Colonoscopy Z12.11 [facility] 0000000 Please pre cert

## 2015-11-04 ENCOUNTER — Encounter: Payer: Self-pay | Admitting: *Deleted

## 2015-11-09 NOTE — Discharge Instructions (Signed)

## 2015-11-10 ENCOUNTER — Encounter: Payer: Self-pay | Admitting: Gastroenterology

## 2015-11-10 NOTE — Telephone Encounter (Signed)
error 

## 2015-11-11 ENCOUNTER — Ambulatory Visit
Admission: RE | Admit: 2015-11-11 | Discharge: 2015-11-11 | Disposition: A | Discharge status: Home / Self Care | Payer: Commercial Managed Care - PPO | Source: Ambulatory Visit | Attending: Gastroenterology | Admitting: Gastroenterology

## 2015-11-11 ENCOUNTER — Ambulatory Visit: Payer: Commercial Managed Care - PPO | Admitting: Anesthesiology

## 2015-11-11 ENCOUNTER — Encounter
Admission: RE | Disposition: A | Discharge status: Home / Self Care | Payer: Self-pay | Source: Ambulatory Visit | Attending: Gastroenterology

## 2015-11-11 DIAGNOSIS — Z86718 Personal history of other venous thrombosis and embolism: Secondary | ICD-10-CM | POA: Diagnosis not present

## 2015-11-11 DIAGNOSIS — K641 Second degree hemorrhoids: Secondary | ICD-10-CM | POA: Diagnosis not present

## 2015-11-11 DIAGNOSIS — I10 Essential (primary) hypertension: Secondary | ICD-10-CM | POA: Insufficient documentation

## 2015-11-11 DIAGNOSIS — Z7982 Long term (current) use of aspirin: Secondary | ICD-10-CM | POA: Insufficient documentation

## 2015-11-11 DIAGNOSIS — Z8042 Family history of malignant neoplasm of prostate: Secondary | ICD-10-CM | POA: Insufficient documentation

## 2015-11-11 DIAGNOSIS — K219 Gastro-esophageal reflux disease without esophagitis: Secondary | ICD-10-CM | POA: Diagnosis not present

## 2015-11-11 DIAGNOSIS — Z87891 Personal history of nicotine dependence: Secondary | ICD-10-CM | POA: Diagnosis not present

## 2015-11-11 DIAGNOSIS — D125 Benign neoplasm of sigmoid colon: Secondary | ICD-10-CM

## 2015-11-11 DIAGNOSIS — Z1211 Encounter for screening for malignant neoplasm of colon: Secondary | ICD-10-CM | POA: Insufficient documentation

## 2015-11-11 DIAGNOSIS — I739 Peripheral vascular disease, unspecified: Secondary | ICD-10-CM | POA: Insufficient documentation

## 2015-11-11 HISTORY — PX: POLYPECTOMY: SHX5525

## 2015-11-11 HISTORY — PX: COLONOSCOPY WITH PROPOFOL: SHX5780

## 2015-11-11 SURGERY — COLONOSCOPY WITH PROPOFOL
Anesthesia: Monitor Anesthesia Care | Wound class: Contaminated

## 2015-11-11 MED ORDER — LACTATED RINGERS IV SOLN
INTRAVENOUS | Status: DC
  Administered 2015-11-11: 09:00:00 via INTRAVENOUS

## 2015-11-11 MED ORDER — PROPOFOL 10 MG/ML IV BOLUS
INTRAVENOUS | Status: DC | PRN
  Administered 2015-11-11: 30 mg via INTRAVENOUS
  Administered 2015-11-11: 40 mg via INTRAVENOUS
  Administered 2015-11-11: 20 mg via INTRAVENOUS
  Administered 2015-11-11: 10 mg via INTRAVENOUS
  Administered 2015-11-11: 100 mg via INTRAVENOUS

## 2015-11-11 MED ORDER — LIDOCAINE HCL (CARDIAC) 20 MG/ML IV SOLN
INTRAVENOUS | Status: DC | PRN
  Administered 2015-11-11: 30 mg via INTRAVENOUS

## 2015-11-11 MED ORDER — STERILE WATER FOR IRRIGATION IR SOLN
Status: DC | PRN
  Administered 2015-11-11: 10:00:00

## 2015-11-11 SURGICAL SUPPLY — 23 items

## 2015-11-11 NOTE — Op Note (Signed)
[facility] G[facility]enterology Patient Name: Patrick Long Procedure Date: 11/11/2015 9:43 AM MRN: [id number] Account #: [id number] Date of Birth: 28-Aug-1950 Admit Type: Outpatient Age: 65 Room: [facility] OR ROOM 01 Gender: Male Note Status: Finalized Procedure:            Colonoscopy Indications:          Screening for colorectal malignant neoplasm Providers:            Lucilla Lame MD, MD Referring MD:         Janine Ores. Rosanna Randy, MD (Referring MD) Medicines:            Propofol per Anesthesia Complications:        No immediate complications. Procedure:            Pre-Anesthesia Assessment:                       - Prior to the procedure, a History and Physical was                        performed, and patient medications and allergies were                        reviewed. The patient's tolerance of previous                        anesthesia was also reviewed. The risks and benefits of                        the procedure and the sedation options and risks were                        discussed with the patient. All questions were                        answered, and informed consent was obtained. Prior                        Anticoagulants: The patient has taken no previous                        anticoagulant or antiplatelet agents. ASA Grade                        Assessment: II - A patient with mild systemic disease.                        After reviewing the risks and benefits, the patient was                        deemed in satisfactory condition to undergo the                        procedure.                       After obtaining informed consent, the colonoscope was                        passed under direct vision. Throughout the procedure,  the patient's blood pressure, pulse, and oxygen                        saturations were monitored continuously. The was                        introduced through the anus and advanced to the the                      cecum, identified by appendiceal orifice and ileocecal                        valve. The colonoscopy was performed without                        difficulty. The patient tolerated the procedure well.                        The quality of the bowel preparation was excellent. Findings:      The perianal and digital rectal examinations were normal.      Two sessile polyps were found in the sigmoid colon. The polyps were 1 to       2 mm in size. These polyps were removed with a cold biopsy forceps.       Resection and retrieval were complete.      Non-bleeding internal hemorrhoids were found during retroflexion. The       hemorrhoids were Grade II (internal hemorrhoids that prolapse but reduce       spontaneously). Impression:           - Two 1 to 2 mm polyps in the sigmoid colon, removed                        with a cold biopsy forceps. Resected and retrieved.                       - Non-bleeding internal hemorrhoids. Recommendation:       - Discharge patient to home.                       - Resume previous diet.                       - Continue present medications.                       - Repeat colonoscopy in 5 years if polyp adenoma and 10                        years if hyperplastic Procedure Code(s):    --- Professional ---                       [phone], Colonoscopy, flexible; with biopsy, single or                        multiple Diagnosis Code(s):    --- Professional ---                       Z12.11, Encounter for screening for malignant neoplasm  of colon                       D12.5, Benign neoplasm of sigmoid colon CPT copyright 2016 American Medical Association. All rights reserved. The codes documented in this report are preliminary and upon coder review may  be revised to meet current compliance requirements. Lucilla Lame MD, MD 11/11/2015 10:11:12 AM This report has been signed electronically. Number of Addenda: 0 Note Initiated On:  11/11/2015 9:43 AM Scope Withdrawal Time: 0 hours 6 minutes 23 seconds  Total Procedure Duration: 0 hours 9 minutes 37 seconds       [facility]

## 2015-11-11 NOTE — H&P (Signed)
  Lucilla Lame, MD [address] [place], [place] [address] Phone: [phone] Fax : [phone]  Primary Care Physician:  Wilhemena Durie, MD Primary Gastroenterologist:  Dr. Allen Norris  Pre-Procedure History & Physical: HPI:  Patrick Long is a 65 y.o. male is here for a screening colonoscopy.   Past Medical History:  Diagnosis Date  . GERD (gastroesophageal reflux disease)   . Hypertension   . Peripheral vascular disease ([place]) 2015   superficial blood clots - right lower leg    Past Surgical History:  Procedure Laterality Date  . ORIF TIBIA & FIBULA FRACTURES    . WISDOM TOOTH EXTRACTION      Prior to Admission medications   Medication Sig Start Date End Date Taking? Authorizing Provider  aspirin 81 MG tablet Take 81 mg by mouth daily.   Yes Historical Provider, MD  losartan (COZAAR) 100 MG tablet take 1 tablet by mouth once daily 11/27/14  Yes Richard Maceo Pro., MD  Multiple Vitamins-Minerals (MULTIVITAMIN ADULT PO) Take 1 tablet by mouth daily.    Yes Historical Provider, MD  NEXIUM 40 MG capsule take 1 capsule by mouth every other day 01/28/15  Yes Richard Maceo Pro., MD    Allergies as of 10/11/2015 - Review Complete 10/11/2015  Allergen Reaction Noted  . Fish allergy Nausea And Vomiting 11/03/2014    Family History  Problem Relation Age of Onset  . Asthma Mother   . Arrhythmia Mother   . Dementia Mother   . Prostate cancer Father   . Stroke Father   . Glaucoma Sister     Social History   Social History  . Marital status: Married    Spouse name: Dorinda  . Number of children: 1  . Years of education: 73   Occupational History  . [place] History Main Topics  . Smoking status: Former Smoker    Years: 10.00    Types: Cigarettes    Quit date: 02/14/1980  . Smokeless tobacco: Never Used  . Alcohol use 3.6 oz/week    6 Cans of beer per week     Comment:     . Drug use: No  . Sexual activity: Yes     Other Topics Concern  . Not on file   Social History Narrative  . No narrative on file    Review of Systems: See HPI, otherwise negative ROS  Physical Exam: BP 133/90   Pulse 69   Temp 97.4 F (36.3 C) (Temporal)   Resp 16   Ht 5' 11.5" (1.816 m)   Wt 186 lb (84.4 kg)   SpO2 100%   BMI 25.58 kg/m  General:   Alert,  pleasant and cooperative in NAD Head:  Normocephalic and atraumatic. Neck:  Supple; no masses or thyromegaly. Lungs:  Clear throughout to auscultation.    Heart:  Regular rate and rhythm. Abdomen:  Soft, nontender and nondistended. Normal bowel sounds, without guarding, and without rebound.   Neurologic:  Alert and  oriented x4;  grossly normal neurologically.  Impression/Plan: Patrick Long is now here to undergo a screening colonoscopy.  Risks, benefits, and alternatives regarding colonoscopy have been reviewed with the patient.  Questions have been answered.  All parties agreeable.

## 2015-11-11 NOTE — Transfer of Care (Signed)
Immediate Anesthesia Transfer of Care Note  Patient: Patrick Long  Procedure(s) Performed: Procedure(s): COLONOSCOPY WITH PROPOFOL (N/A) POLYPECTOMY  Patient Location: PACU  Anesthesia Type: MAC  Level of Consciousness: awake, alert  and patient cooperative  Airway and Oxygen Therapy: Patient Spontanous Breathing and Patient connected to supplemental oxygen  Post-op Assessment: Post-op Vital signs reviewed, Patient's Cardiovascular Status Stable, Respiratory Function Stable, Patent Airway and No signs of Nausea or vomiting  Post-op Vital Signs: Reviewed and stable  Complications: No apparent anesthesia complications

## 2015-11-11 NOTE — Anesthesia Preprocedure Evaluation (Signed)
Anesthesia Evaluation  Patient identified by MRN, date of birth, ID band  Reviewed: NPO status   History of Anesthesia Complications Negative for: history of anesthetic complications  Airway Mallampati: II  TM Distance: >3 FB Neck ROM: full    Dental no notable dental hx.    Pulmonary neg pulmonary ROS, former smoker,    Pulmonary exam normal        Cardiovascular Exercise Tolerance: Good hypertension, + DVT (superficial blood clots - right lower leg 2015)  Normal cardiovascular exam     Neuro/Psych negative neurological ROS  negative psych ROS   GI/Hepatic Neg liver ROS, GERD  Controlled,  Endo/Other  negative endocrine ROS  Renal/GU negative Renal ROS  negative genitourinary   Musculoskeletal   Abdominal   Peds  Hematology negative hematology ROS (+)   Anesthesia Other Findings   Reproductive/Obstetrics                             Anesthesia Physical Anesthesia Plan  ASA: II  Anesthesia Plan: MAC   Post-op Pain Management:    Induction:   Airway Management Planned:   Additional Equipment:   Intra-op Plan:   Post-operative Plan:   Informed Consent: I have reviewed the patients History and Physical, chart, labs and discussed the procedure including the risks, benefits and alternatives for the proposed anesthesia with the patient or authorized representative who has indicated his/her understanding and acceptance.     Plan Discussed with: CRNA  Anesthesia Plan Comments:         Anesthesia Quick Evaluation

## 2015-11-11 NOTE — Anesthesia Postprocedure Evaluation (Signed)
Anesthesia Post Note  Patient: Patrick Long  Procedure(s) Performed: Procedure(s) (LRB): COLONOSCOPY WITH PROPOFOL (N/A) POLYPECTOMY  Patient location during evaluation: PACU Anesthesia Type: MAC Level of consciousness: awake and alert Pain management: pain level controlled Vital Signs Assessment: post-procedure vital signs reviewed and stable Respiratory status: spontaneous breathing, nonlabored ventilation, respiratory function stable and patient connected to nasal cannula oxygen Cardiovascular status: stable and blood pressure returned to baseline Anesthetic complications: no    Sruthi Maurer

## 2015-11-12 ENCOUNTER — Encounter: Payer: Self-pay | Admitting: Gastroenterology

## 2015-11-16 ENCOUNTER — Encounter: Payer: Self-pay | Admitting: Gastroenterology

## 2015-11-23 ENCOUNTER — Encounter: Payer: Self-pay | Admitting: Gastroenterology

## 2015-11-28 ENCOUNTER — Other Ambulatory Visit: Payer: Self-pay | Admitting: Family Medicine

## 2016-01-20 ENCOUNTER — Ambulatory Visit (INDEPENDENT_AMBULATORY_CARE_PROVIDER_SITE_OTHER): Payer: Commercial Managed Care - PPO | Admitting: Physician Assistant

## 2016-01-20 ENCOUNTER — Encounter: Payer: Self-pay | Admitting: Physician Assistant

## 2016-01-20 VITALS — BP 126/80 | HR 72 | Temp 98.5°F | Resp 16 | Wt 197.0 lb

## 2016-01-20 DIAGNOSIS — J019 Acute sinusitis, unspecified: Secondary | ICD-10-CM

## 2016-01-20 MED ORDER — AMOXICILLIN-POT CLAVULANATE 875-125 MG PO TABS: 1.0000 | ORAL_TABLET | Freq: Two times a day (BID) | ORAL | 0 refills | Status: AC

## 2016-01-20 MED ORDER — AMOXICILLIN-POT CLAVULANATE 875-125 MG PO TABS: 1.0000 | ORAL_TABLET | Freq: Two times a day (BID) | ORAL | 0 refills | Status: DC

## 2016-01-20 MED ORDER — HYDROCODONE-HOMATROPINE 5-1.5 MG/5ML PO SYRP: 5.0000 mL | ORAL_SOLUTION | Freq: Every day | ORAL | 0 refills | Status: DC

## 2016-01-20 NOTE — Progress Notes (Signed)
[place]  Chief Complaint  Patient presents with  . URI    Started about 6 days ago.   . Sinusitis    Subjective:    Patient ID: Patrick Long, male    DOB: 12-Jun-1950, 65 y.o.   MRN: [id number]  Upper Respiratory Infection: Patrick Long is a 65 y.o. male with a past medical history significant for Allergic Rhinitis complaining of symptoms of a URI, possible sinusitis. Symptoms include congestion, cough, plugged sensation in both ears and sore throat. Onset of symptoms was 6 days ago, gradually worsening since that time. He also c/o congestion, cough described as productive and nasal congestion for the past 6 days .  He is drinking plenty of fluids. Evaluation to date: none. Treatment to date: antihistamines and decongestants. The treatment has provided minimal.   Review of Systems  Constitutional: Positive for fatigue. Negative for activity change, appetite change, chills, diaphoresis, fever and unexpected weight change.  HENT: Positive for congestion, ear pain, postnasal drip, rhinorrhea, sinus pain and sinus pressure. Negative for ear discharge, nosebleeds, sneezing, sore throat, tinnitus and trouble swallowing.   Eyes: Negative.   Respiratory: Positive for cough. Negative for apnea, choking, chest tightness, shortness of breath, wheezing and stridor.   Gastrointestinal: Negative.   Musculoskeletal: Negative.   Neurological: Positive for headaches. Negative for dizziness and light-headedness.       Objective:   BP 126/80 (BP Location: Left Arm, Patient Position: Sitting, Cuff Size: Normal)   Pulse 72   Temp 98.5 F (36.9 C) (Oral)   Resp 16   Wt 197 lb (89.4 kg)   BMI 27.09 kg/m   Patient Active Problem List   Diagnosis Date Noted  . Special screening for malignant neoplasms, colon   . Benign neoplasm of sigmoid colon   . Prostate cancer screening 09/22/2015  . Colon cancer screening 09/22/2015  . Actinic keratoses  06/29/2014  . Allergic rhinitis 06/29/2014  . Narrowing of intervertebral disc space 06/29/2014  . Deep vein thrombosis ([place]) 06/29/2014  . Essential (primary) hypertension 06/29/2014  . Acid reflux 06/29/2014  . HLD (hyperlipidemia) 06/29/2014  . Squamous cell carcinoma 06/29/2014  . Gastro-esophageal reflux disease without esophagitis 06/29/2014  . Connective tissue and disc stenosis of intervertebral foramina of abdomen and other regions 06/29/2014    Outpatient Encounter Prescriptions as of 01/20/2016  Medication Sig Note  . aspirin 81 MG tablet Take 81 mg by mouth daily.   Marland Kitchen losartan (COZAAR) 100 MG tablet take 1 tablet by mouth once daily   . Multiple Vitamins-Minerals (MULTIVITAMIN ADULT PO) Take 1 tablet by mouth daily.  06/29/2014: Received from: [facility]  . NEXIUM 40 MG capsule take 1 capsule by mouth every other day   . AFLURIA PRESERVATIVE FREE 0.5 ML SUSY inject 0.5 milliliter intramuscularly 01/20/2016: Received from: External Pharmacy  . amoxicillin-clavulanate (AUGMENTIN) 875-125 MG tablet Take 1 tablet by mouth 2 (two) times daily.   Marland Kitchen HYDROcodone-homatropine (HYCODAN) 5-1.5 MG/5ML syrup Take 5 mLs by mouth at bedtime.   . [DISCONTINUED] amoxicillin-clavulanate (AUGMENTIN) 875-125 MG tablet Take 1 tablet by mouth 2 (two) times daily.    No facility-administered encounter medications on file as of 01/20/2016.     Allergies  Allergen Reactions  . Fish Allergy Nausea And Vomiting    Can not eat any fish with bones       Physical Exam  Constitutional: He is oriented to person, place, and time. He appears well-developed and well-nourished. No distress.  HENT:  Right Ear: External ear normal.  Left Ear: External ear normal.  Nose: Right sinus exhibits maxillary sinus tenderness and frontal sinus tenderness. Left sinus exhibits maxillary sinus tenderness and frontal sinus tenderness.  Mouth/Throat: Oropharynx is clear and moist and mucous membranes  are normal. No oropharyngeal exudate, posterior oropharyngeal edema, posterior oropharyngeal erythema or tonsillar abscesses.  Eyes: Conjunctivae are normal.  Neck: Normal range of motion.  Cardiovascular: Normal rate and regular rhythm.   Pulmonary/Chest: Effort normal and breath sounds normal.  Neurological: He is alert and oriented to person, place, and time.  Skin: Skin is warm and dry. He is not diaphoretic.  Psychiatric: He has a normal mood and affect. His behavior is normal.       Assessment & Plan:   Problem List Items Addressed This Visit    None    Visit Diagnoses    Acute non-recurrent sinusitis, unspecified location    -  Primary   Relevant Medications   amoxicillin-clavulanate (AUGMENTIN) 875-125 MG tablet   HYDROcodone-homatropine (HYCODAN) 5-1.5 MG/5ML syrup      Patient is 65 y/o male presenting with sinusitis. Gave hard script for Augmentin to be filled Sunday if he still has symptoms. Advised against Sudafed, recommend Coricidin and just plain Mucinex (no decongestant) for symptoms. Gave cough syrup.   The entirety of the information documented in the History of Present Illness, Review of Systems and Physical Exam were personally obtained by me. Portions of this information were initially documented by Ashley Royalty, CMA and reviewed by me for thoroughness and accuracy.    No Follow-up on file.    Patient Instructions  Sinusitis en los adultos (Sinusitis, Adult) La sinusitis es la inflamacin y Conservation officer, historic buildings en los senos paranasales. Los senos paranasales son espacios vacos en los huesos alrededor del rostro. Estos se encuentran en estos lugares:  Alrededor de los ojos.  En la mitad de la frente.  Detrs de Mudlogger.  En los pmulos. Los senos y las fosas nasales estn cubiertos de un lquido fibroso (mucosidad). Normalmente, la mucosidad drena a travs de los senos. Cuando los tejidos nasales se inflaman o hinchan, la mucosidad puede quedar atrapada o  bloqueada, por lo que el aire no puede fluir por los senos paranasales. Esto permite que se desarrollen bacterias, virus y hongos, lo que produce infecciones. [place], use o aplique los medicamentos de venta libre y los recetados solamente como se lo haya indicado el mdico. Estos pueden incluir aerosoles nasales.  Si le recetaron un antibitico, tmelo como se lo haya indicado el mdico. No deje de tomar los antibiticos aunque comience a Sports administrator. Hidrtese y humidifique los ambientes   Beba suficiente agua para Theatre manager la orina clara o de color amarillo plido.  Use un humidificador de vapor fro para mantener la humedad de su hogar por encima del 50%.  Inhale vapor durante 10a 31minutos, de 3a 4veces al da o como se lo haya indicado el mdico. Puede hacer esto en el bao con el vapor del agua caliente de la ducha.  Trate de no exponerse al aire fro o seco. Reposo   Descanse todo lo que pueda.  Duerma con la cabeza elevada.  Asegrese de dormir lo suficiente cada noche. Instrucciones generales   Pngase un pao caliente y hmedo en el rostro 3o 4veces al da, o como se lo haya indicado su mdico. Esto ayuda a Building services engineer.  Lave sus manos frecuentemente con agua y [place].  Use un desinfectante para manos si no dispone de [place] y [place].  No fume. Evite estar cerca de personas que fuman (fumador pasivo).  Concurra a todas las visitas de control como se lo haya indicado el mdico. Esto es importante. SOLICITE AYUDA SI:  Tiene fiebre.  Los sntomas empeoran.  Los sntomas no mejoran en el perodo de 10das. SOLICITE AYUDA DE INMEDIATO SI:  Siente un dolor de cabeza muy intenso.  No puede dejar de vomitar.  Tiene dolor o hinchazn en la zona del rostro o los ojos.  Tiene dificultad para ver.  Se siente confundido.  Tiene el cuello rgido.  Tiene dificultad para respirar. Esta informacin no tiene Hydrologist el consejo del mdico. Asegrese de hacerle al mdico cualquier pregunta que tenga. Document Released: 10/25/2011 Document Revised: 05/24/2015 Document Reviewed: 11/25/2014 Elsevier Interactive Patient Education  2017 [employer].

## 2016-01-20 NOTE — Patient Instructions (Signed)
Sinusitis en los adultos (Sinusitis, Adult) La sinusitis es la inflamacin y Conservation officer, historic buildings en los senos paranasales. Los senos paranasales son espacios vacos en los huesos alrededor del rostro. Estos se encuentran en estos lugares:  Alrededor de los ojos.  En la mitad de la frente.  Detrs de Mudlogger.  En los pmulos. Los senos y las fosas nasales estn cubiertos de un lquido fibroso (mucosidad). Normalmente, la mucosidad drena a travs de los senos. Cuando los tejidos nasales se inflaman o hinchan, la mucosidad puede quedar atrapada o bloqueada, por lo que el aire no puede fluir por los senos paranasales. Esto permite que se desarrollen bacterias, virus y hongos, lo que produce infecciones. [place], use o aplique los medicamentos de venta libre y los recetados solamente como se lo haya indicado el mdico. Estos pueden incluir aerosoles nasales.  Si le recetaron un antibitico, tmelo como se lo haya indicado el mdico. No deje de tomar los antibiticos aunque comience a Sports administrator. Hidrtese y humidifique los ambientes   Beba suficiente agua para Theatre manager la orina clara o de color amarillo plido.  Use un humidificador de vapor fro para mantener la humedad de su hogar por encima del 50%.  Inhale vapor durante 10a 38minutos, de 3a 4veces al da o como se lo haya indicado el mdico. Puede hacer esto en el bao con el vapor del agua caliente de la ducha.  Trate de no exponerse al aire fro o seco. Reposo   Descanse todo lo que pueda.  Duerma con la cabeza elevada.  Asegrese de dormir lo suficiente cada noche. Instrucciones generales   Pngase un pao caliente y hmedo en el rostro 3o 4veces al da, o como se lo haya indicado su mdico. Esto ayuda a Building services engineer.  Lave sus manos frecuentemente con agua y [place]. Use un desinfectante para manos si no dispone de [place] y [place].  No fume. Evite estar cerca de personas que fuman  (fumador pasivo).  Concurra a todas las visitas de control como se lo haya indicado el mdico. Esto es importante. SOLICITE AYUDA SI:  Tiene fiebre.  Los sntomas empeoran.  Los sntomas no mejoran en el perodo de 10das. SOLICITE AYUDA DE INMEDIATO SI:  Siente un dolor de cabeza muy intenso.  No puede dejar de vomitar.  Tiene dolor o hinchazn en la zona del rostro o los ojos.  Tiene dificultad para ver.  Se siente confundido.  Tiene el cuello rgido.  Tiene dificultad para respirar. Esta informacin no tiene Marine scientist el consejo del mdico. Asegrese de hacerle al mdico cualquier pregunta que tenga. Document Released: 10/25/2011 Document Revised: 05/24/2015 Document Reviewed: 11/25/2014 Elsevier Interactive Patient Education  2017 [employer].

## 2016-02-25 ENCOUNTER — Encounter: Payer: Self-pay | Admitting: Physician Assistant

## 2016-02-25 ENCOUNTER — Ambulatory Visit (INDEPENDENT_AMBULATORY_CARE_PROVIDER_SITE_OTHER): Payer: Commercial Managed Care - PPO | Admitting: Physician Assistant

## 2016-02-25 VITALS — BP 124/86 | HR 80 | Temp 97.5°F | Resp 16 | Wt 194.0 lb

## 2016-02-25 DIAGNOSIS — J0110 Acute frontal sinusitis, unspecified: Secondary | ICD-10-CM

## 2016-02-25 MED ORDER — AMOXICILLIN-POT CLAVULANATE 875-125 MG PO TABS: 1.0000 | ORAL_TABLET | Freq: Two times a day (BID) | ORAL | 0 refills | Status: AC

## 2016-02-25 NOTE — Progress Notes (Signed)
[place]  Chief Complaint  Patient presents with  . URI    Started about five days ago.     Subjective:    Patient ID: Patrick Long, male    DOB: [birth year], 66 y.o.   MRN: [id number]  Upper Respiratory Infection: Patrick Long is a 66 y.o. male with a past medical history significant for Seasonal allergies complaining of symptoms of a URI, possible sinusitis. Patient was seen in clinic on 01/17/2016 with similar symptoms and prescribed Augmentin x 10 days which he completed and said made him feel better. His symptoms however seem to have relapsed. Today he reports his symptoms include congestion, cough and sore throat. Onset of symptoms was 5 days ago, unchanged since that time. He also c/o congestion, cough described as nonproductive, headache described as Sinus headache and low grade fever for the past 5 days .  He is drinking plenty of fluids. Evaluation to date: none. Treatment to date: cough suppressants and nasal steroids. The treatment has provided minimal relief. No sick exposures, had flu shots. No muscle aches. Vomiting 2/2 coughing and PND.  Review of Systems  Constitutional: Positive for chills, fatigue and fever (Low grade 99.6 yesterday). Negative for activity change, appetite change, diaphoresis and unexpected weight change.  HENT: Positive for congestion, postnasal drip, sinus pain, sinus pressure and sore throat. Negative for ear discharge, ear pain, hearing loss, nosebleeds, rhinorrhea, sneezing, tinnitus, trouble swallowing and voice change.   Eyes: Negative for photophobia, pain, discharge, redness, itching and visual disturbance.       Pt reports pressure in his eyes.   Respiratory: Positive for cough, chest tightness and shortness of breath. Negative for apnea, choking, wheezing and stridor.   Gastrointestinal: Positive for vomiting (Only vomited on once on Wednesday.).  Neurological: Positive for headaches. Negative for  dizziness and light-headedness.       Objective:   BP 124/86 (BP Location: Left Arm, Patient Position: Sitting, Cuff Size: Normal)   Pulse 80   Temp 97.5 F (36.4 C) (Oral)   Resp 16   Wt 194 lb (88 kg)   BMI 26.68 kg/m   Patient Active Problem List   Diagnosis Date Noted  . Special screening for malignant neoplasms, colon   . Benign neoplasm of sigmoid colon   . Prostate cancer screening 09/22/2015  . Colon cancer screening 09/22/2015  . Actinic keratoses 06/29/2014  . Allergic rhinitis 06/29/2014  . Narrowing of intervertebral disc space 06/29/2014  . Deep vein thrombosis ([place]) 06/29/2014  . Essential (primary) hypertension 06/29/2014  . Acid reflux 06/29/2014  . HLD (hyperlipidemia) 06/29/2014  . Squamous cell carcinoma 06/29/2014  . Gastro-esophageal reflux disease without esophagitis 06/29/2014  . Connective tissue and disc stenosis of intervertebral foramina of abdomen and other regions 06/29/2014    Outpatient Encounter Prescriptions as of 02/25/2016  Medication Sig Note  . aspirin 81 MG tablet Take 81 mg by mouth daily.   Marland Kitchen HYDROcodone-homatropine (HYCODAN) 5-1.5 MG/5ML syrup Take 5 mLs by mouth at bedtime.   Marland Kitchen losartan (COZAAR) 100 MG tablet take 1 tablet by mouth once daily   . Multiple Vitamins-Minerals (MULTIVITAMIN ADULT PO) Take 1 tablet by mouth daily.  06/29/2014: Received from: [facility]  . NEXIUM 40 MG capsule take 1 capsule by mouth every other day   . AFLURIA PRESERVATIVE FREE 0.5 ML SUSY inject 0.5 milliliter intramuscularly 01/20/2016: Received from: External Pharmacy  . amoxicillin-clavulanate (AUGMENTIN) 875-125 MG tablet Take 1 tablet by mouth 2 (two) times  daily.    No facility-administered encounter medications on file as of 02/25/2016.     Allergies  Allergen Reactions  . Fish Allergy Nausea And Vomiting    Can not eat any fish with bones       Physical Exam  Constitutional: He is oriented to person, place, and time.  He appears well-developed and well-nourished. No distress.  HENT:  Right Ear: External ear normal.  Left Ear: External ear normal.  Nose: Right sinus exhibits maxillary sinus tenderness and frontal sinus tenderness. Left sinus exhibits maxillary sinus tenderness and frontal sinus tenderness.  Mouth/Throat: Oropharynx is clear and moist and mucous membranes are normal. No oropharyngeal exudate, posterior oropharyngeal edema, posterior oropharyngeal erythema or tonsillar abscesses.  Eyes: Conjunctivae are normal.  Neck: Normal range of motion.  Cardiovascular: Normal rate and regular rhythm.   Pulmonary/Chest: Effort normal and breath sounds normal.  Neurological: He is alert and oriented to person, place, and time.  Skin: Skin is warm and dry. He is not diaphoretic.  Psychiatric: He has a normal mood and affect. His behavior is normal.       Assessment & Plan:   1. Acute frontal sinusitis, recurrence not specified  Patient representing with sinusitis. Difficult to tell if this is old infection recurring or new infection. Augmentin did work for him last time and I think it's reasonable to try another round to see if this will completely eliminate symptoms. Counseled patient on taking 2nd gen antihistamine for PND and also he may use his Hycodan from last time PRN at night for coughing.   - amoxicillin-clavulanate (AUGMENTIN) 875-125 MG tablet; Take 1 tablet by mouth 2 (two) times daily.  Dispense: 20 tablet; Refill: 0  Return if symptoms worsen or fail to improve.   Patient Instructions  Sinusitis, Adult Sinusitis is soreness and inflammation of your sinuses. Sinuses are hollow spaces in the bones around your face. They are located:  Around your eyes.  In the middle of your forehead.  Behind your nose.  In your cheekbones. Your sinuses and nasal passages are lined with a stringy fluid (mucus). Mucus normally drains out of your sinuses. When your nasal tissues get inflamed or  swollen, the mucus can get trapped or blocked so air cannot flow through your sinuses. This lets bacteria, viruses, and funguses grow, and that leads to infection. Follow these instructions at home: Medicines  Take, use, or apply over-the-counter and prescription medicines only as told by your doctor. These may include nasal sprays.  If you were prescribed an antibiotic medicine, take it as told by your doctor. Do not stop taking the antibiotic even if you start to feel better. Hydrate and Humidify  Drink enough water to keep your pee (urine) clear or pale yellow.  Use a cool mist humidifier to keep the humidity level in your home above 50%.  Breathe in steam for 10-15 minutes, 3-4 times a day or as told by your doctor. You can do this in the bathroom while a hot shower is running.  Try not to spend time in cool or dry air. Rest  Rest as much as possible.  Sleep with your head raised (elevated).  Make sure to get enough sleep each night. General instructions  Put a warm, moist washcloth on your face 3-4 times a day or as told by your doctor. This will help with discomfort.  Wash your hands often with soap and water. If there is no soap and water, use hand sanitizer.  Do  not smoke. Avoid being around people who are smoking (secondhand smoke).  Keep all follow-up visits as told by your doctor. This is important. Contact a doctor if:  You have a fever.  Your symptoms get worse.  Your symptoms do not get better within 10 days. Get help right away if:  You have a very bad headache.  You cannot stop throwing up (vomiting).  You have pain or swelling around your face or eyes.  You have trouble seeing.  You feel confused.  Your neck is stiff.  You have trouble breathing. This information is not intended to replace advice given to you by your health care provider. Make sure you discuss any questions you have with your health care provider. Document Released: 07/19/2007  Document Revised: 09/26/2015 Document Reviewed: 11/25/2014 Elsevier Interactive Patient Education  2017 [employer].     The entirety of the information documented in the History of Present Illness, Review of Systems and Physical Exam were personally obtained by me. Portions of this information were initially documented by Ashley Royalty, CMA and reviewed by me for thoroughness and accuracy.

## 2016-02-25 NOTE — Patient Instructions (Signed)

## 2016-03-24 ENCOUNTER — Other Ambulatory Visit: Payer: Self-pay | Admitting: Family Medicine

## 2016-03-28 ENCOUNTER — Ambulatory Visit: Payer: Self-pay | Admitting: Family Medicine

## 2016-03-30 ENCOUNTER — Ambulatory Visit (INDEPENDENT_AMBULATORY_CARE_PROVIDER_SITE_OTHER): Payer: Commercial Managed Care - PPO | Admitting: Family Medicine

## 2016-03-30 VITALS — BP 128/90 | HR 66 | Temp 98.6°F | Resp 14 | Wt 201.0 lb

## 2016-03-30 DIAGNOSIS — Z6827 Body mass index (BMI) 27.0-27.9, adult: Secondary | ICD-10-CM | POA: Diagnosis not present

## 2016-03-30 DIAGNOSIS — I10 Essential (primary) hypertension: Secondary | ICD-10-CM | POA: Diagnosis not present

## 2016-03-30 DIAGNOSIS — E784 Other hyperlipidemia: Secondary | ICD-10-CM | POA: Diagnosis not present

## 2016-03-30 DIAGNOSIS — K219 Gastro-esophageal reflux disease without esophagitis: Secondary | ICD-10-CM | POA: Diagnosis not present

## 2016-03-30 DIAGNOSIS — E7849 Other hyperlipidemia: Secondary | ICD-10-CM

## 2016-03-30 NOTE — Progress Notes (Signed)
Patrick Long  MRN: [id number] DOB: December 22, 1950  Subjective:  HPI  Patient is here for follow up. Last follow up visit was on 09/22/15 and all labs were checked at that time. He is not checking his b/p. No cardiac symptoms. BP Readings from Last 3 Encounters:  03/30/16 128/90  02/25/16 124/86  01/20/16 126/80   He is taking nexium for GERD every other day and symptoms are stable on this regimen. Patient Active Problem List   Diagnosis Date Noted  . Special screening for malignant neoplasms, colon   . Benign neoplasm of sigmoid colon   . Prostate cancer screening 09/22/2015  . Colon cancer screening 09/22/2015  . Actinic keratoses 06/29/2014  . Allergic rhinitis 06/29/2014  . Narrowing of intervertebral disc space 06/29/2014  . Deep vein thrombosis ([place]) 06/29/2014  . Essential (primary) hypertension 06/29/2014  . Acid reflux 06/29/2014  . HLD (hyperlipidemia) 06/29/2014  . Squamous cell carcinoma 06/29/2014  . Gastro-esophageal reflux disease without esophagitis 06/29/2014  . Connective tissue and disc stenosis of intervertebral foramina of abdomen and other regions 06/29/2014    Past Medical History:  Diagnosis Date  . GERD (gastroesophageal reflux disease)   . Hypertension   . Peripheral vascular disease ([place]) 2015   superficial blood clots - right lower leg    Social History   Social History  . Marital status: Married    Spouse name: Dorinda  . Number of children: 1  . Years of education: 15   Occupational History  . [place] History Main Topics  . Smoking status: Former Smoker    Years: 10.00    Types: Cigarettes    Quit date: 02/14/1980  . Smokeless tobacco: Never Used  . Alcohol use 3.6 oz/week    6 Cans of beer per week     Comment:     . Drug use: No  . Sexual activity: Yes   Other Topics Concern  . Not on file   Social History Narrative  . No narrative on file    Outpatient Encounter Prescriptions as of  03/30/2016  Medication Sig Note  . AFLURIA PRESERVATIVE FREE 0.5 ML SUSY inject 0.5 milliliter intramuscularly 01/20/2016: Received from: External Pharmacy  . aspirin 81 MG tablet Take 81 mg by mouth daily.   Marland Kitchen losartan (COZAAR) 100 MG tablet take 1 tablet by mouth once daily   . Multiple Vitamins-Minerals (MULTIVITAMIN ADULT PO) Take 1 tablet by mouth daily.  06/29/2014: Received from: [facility]  . NEXIUM 40 MG capsule take 1 capsule by mouth EVERY OTHER DAY AS DIRECTED   . [DISCONTINUED] HYDROcodone-homatropine (HYCODAN) 5-1.5 MG/5ML syrup Take 5 mLs by mouth at bedtime.    No facility-administered encounter medications on file as of 03/30/2016.     Allergies  Allergen Reactions  . Fish Allergy Nausea And Vomiting    Can not eat any fish with bones    Review of Systems  Constitutional: Negative.   Respiratory: Negative.   Cardiovascular: Negative.   Gastrointestinal:       Stable on medication  Musculoskeletal: Negative.   Endo/Heme/Allergies: Negative.   Psychiatric/Behavioral: Negative.     Objective:  BP 128/90   Pulse 66   Temp 98.6 F (37 C)   Resp 14   Wt 201 lb (91.2 kg)   BMI 27.64 kg/m   Physical Exam  Constitutional: He is oriented to person, place, and time and well-developed, well-nourished, and in no distress.  HENT:  Head: Normocephalic and atraumatic.  Right Ear: External ear normal.  Left Ear: External ear normal.  Nose: Nose normal.  Eyes: Conjunctivae are normal. Pupils are equal, round, and reactive to light.  Neck: Normal range of motion. Neck supple.  Cardiovascular: Normal rate, regular rhythm, normal heart sounds and intact distal pulses.   No murmur heard. Pulmonary/Chest: Effort normal and breath sounds normal. No respiratory distress. He has no wheezes.  Musculoskeletal: He exhibits no edema or tenderness.  Neurological: He is alert and oriented to person, place, and time.  Skin: Skin is warm and dry.  Psychiatric: Mood,  memory, affect and judgment normal.   Assessment and Plan :  1. Essential (primary) hypertension Stable. Continue current medication 2. Other hyperlipidemia Trig higher on last check. Discussed with patient working on habits. Follow for now.  3. Gastro-esophageal reflux disease without esophagitis Stable on Nexium every other day.  4. BMI 27.0-27.9,adult Work on habits.  HPI, Exam and A&P transcribed under direction and in the presence of Miguel Aschoff, MD.  I have done the exam and reviewed the chart and it is accurate to the best of my knowledge. Development worker, community has been used and  any errors in dictation or transcription are unintentional. Miguel Aschoff M.D. [facility]

## 2016-07-04 ENCOUNTER — Encounter: Payer: Self-pay | Admitting: Family Medicine

## 2016-07-04 ENCOUNTER — Ambulatory Visit (INDEPENDENT_AMBULATORY_CARE_PROVIDER_SITE_OTHER): Payer: Commercial Managed Care - PPO | Admitting: Family Medicine

## 2016-07-04 VITALS — BP 122/74 | HR 57 | Temp 98.4°F | Resp 16 | Wt 197.8 lb

## 2016-07-04 DIAGNOSIS — H6981 Other specified disorders of Eustachian tube, right ear: Secondary | ICD-10-CM | POA: Diagnosis not present

## 2016-07-04 DIAGNOSIS — J301 Allergic rhinitis due to pollen: Secondary | ICD-10-CM

## 2016-07-04 NOTE — Progress Notes (Signed)
Subjective:     Patient ID: Patrick Long, male   DOB: 27-Jul-1950, 66 y.o.   MRN: [id number]  HPI  Chief Complaint  Patient presents with  . Ear Pain    Patient comes into office today with complaints of right ear pain since 5/19, patient states that he woke up on 5/19 feeling off balance. Patient denies sinus pain/pressure or descreased hearing.   States he has started to have a resurgence of seasonal allergy sx with throat clearing/PND. No change in hearing or precedent cold sx.Reports he is off his allergy medication at this time.   Review of Systems     Objective:   Physical Exam  Constitutional: He appears well-developed and well-nourished. No distress.  Musculoskeletal:  No TMJ tenderness  Ears: T.M's dull without inflammation Throat: no tonsillar enlargement or exudate Neck: no cervical adenopathy     Assessment:    1. Eustachian tube dysfunction, right  2. Seasonal allergic rhinitis due to pollen    Plan:    Resume allergy medication.

## 2016-07-04 NOTE — Patient Instructions (Signed)
Resume allergy medication especially steroid nasal spray

## 2016-09-27 ENCOUNTER — Ambulatory Visit (INDEPENDENT_AMBULATORY_CARE_PROVIDER_SITE_OTHER): Payer: Commercial Managed Care - PPO | Admitting: Family Medicine

## 2016-09-27 VITALS — BP 146/90 | HR 80 | Temp 98.2°F | Resp 14 | Wt 194.0 lb

## 2016-09-27 DIAGNOSIS — Z1211 Encounter for screening for malignant neoplasm of colon: Secondary | ICD-10-CM

## 2016-09-27 DIAGNOSIS — R739 Hyperglycemia, unspecified: Secondary | ICD-10-CM | POA: Diagnosis not present

## 2016-09-27 DIAGNOSIS — Z125 Encounter for screening for malignant neoplasm of prostate: Secondary | ICD-10-CM

## 2016-09-27 DIAGNOSIS — Z0000 Encounter for general adult medical examination without abnormal findings: Secondary | ICD-10-CM | POA: Diagnosis not present

## 2016-09-27 LAB — POCT URINALYSIS DIPSTICK
Bilirubin, UA: NEGATIVE
Blood, UA: NEGATIVE
Glucose, UA: NEGATIVE
Ketones, UA: NEGATIVE
Leukocytes, UA: NEGATIVE
Nitrite, UA: NEGATIVE
Protein, UA: NEGATIVE
Spec Grav, UA: 1.01 (ref 1.010–1.025)
Urobilinogen, UA: 0.2 E.U./dL
pH, UA: 7 (ref 5.0–8.0)

## 2016-09-27 LAB — IFOBT (OCCULT BLOOD): IFOBT: NEGATIVE

## 2016-09-27 NOTE — Patient Instructions (Signed)
Recommend trying Tumeric and Chondrotin

## 2016-09-27 NOTE — Progress Notes (Signed)
Patient: Patrick Long, Male    DOB: 02-Sep-1950, 66 y.o.   MRN: [id number] Visit Date: 09/27/2016  Today's Provider: Wilhemena Durie, MD   Chief Complaint  Patient presents with  . Annual Exam   Subjective:  Patrick Long is a 66 y.o. male who presents today for health maintenance and complete physical. He feels well. He reports exercising does not have a regimen He reports he is sleeping well. Immunization History  Administered Date(s) Administered  . Influenza, Seasonal, Injecte, Preservative Fre 11/23/2015  . Pneumococcal Conjugate-13 09/22/2015  . Td 11/03/2003  . Tdap 08/05/2013  . Zoster 08/05/2013   Last colonoscopy was 11/11/15 hyperplastic polyps. Routine lab work was done on 09/23/15.  Review of Systems  Constitutional: Negative.   HENT: Negative.   Eyes: Negative.   Respiratory: Negative.   Cardiovascular: Negative.   Gastrointestinal: Negative.   Endocrine: Negative.   Genitourinary: Negative.   Musculoskeletal: Negative.   Skin: Negative.   Allergic/Immunologic: Negative.   Neurological: Negative.   Hematological: Negative.   Psychiatric/Behavioral: Negative.     Social History   Social History  . Marital status: Married    Spouse name: Dorinda  . Number of children: 1  . Years of education: 55   Occupational History  . [place] History Main Topics  . Smoking status: Former Smoker    Years: 10.00    Types: Cigarettes    Quit date: 02/14/1980  . Smokeless tobacco: Never Used  . Alcohol use 3.6 oz/week    6 Cans of beer per week     Comment:     . Drug use: No  . Sexual activity: Yes   Other Topics Concern  . Not on file   Social History Narrative  . No narrative on file    Patient Active Problem List   Diagnosis Date Noted  . Special screening for malignant neoplasms, colon   . Benign neoplasm of sigmoid colon   . Prostate cancer screening 09/22/2015  . Colon cancer screening 09/22/2015  . Actinic  keratoses 06/29/2014  . Allergic rhinitis 06/29/2014  . Narrowing of intervertebral disc space 06/29/2014  . Deep vein thrombosis ([place]) 06/29/2014  . Essential (primary) hypertension 06/29/2014  . Acid reflux 06/29/2014  . HLD (hyperlipidemia) 06/29/2014  . Squamous cell carcinoma 06/29/2014  . Gastro-esophageal reflux disease without esophagitis 06/29/2014  . Connective tissue and disc stenosis of intervertebral foramina of abdomen and other regions 06/29/2014    Past Surgical History:  Procedure Laterality Date  . COLONOSCOPY WITH PROPOFOL N/A 11/11/2015   Procedure: COLONOSCOPY WITH PROPOFOL;  Surgeon: Lucilla Lame, MD;  Location: [facility];  Service: Endoscopy;  Laterality: N/A;  . ORIF TIBIA & FIBULA FRACTURES    . POLYPECTOMY  11/11/2015   Procedure: POLYPECTOMY;  Surgeon: Lucilla Lame, MD;  Location: [facility];  Service: Endoscopy;;  . WISDOM TOOTH EXTRACTION      His family history includes Arrhythmia in his mother; Asthma in his mother; Dementia in his mother; Glaucoma in his sister; Prostate cancer in his father; Stroke in his father.     Outpatient Encounter Prescriptions as of 09/27/2016  Medication Sig Note  . aspirin 81 MG tablet Take 81 mg by mouth daily.   Marland Kitchen losartan (COZAAR) 100 MG tablet take 1 tablet by mouth once daily   . Multiple Vitamins-Minerals (MULTIVITAMIN ADULT PO) Take 1 tablet by mouth daily.  06/29/2014: Received from: [facility]  . NEXIUM 40  MG capsule take 1 capsule by mouth EVERY OTHER DAY AS DIRECTED    No facility-administered encounter medications on file as of 09/27/2016.     Patient Care Team: Jerrol Banana., MD as PCP - General (Family Medicine)      Objective:   Vitals:  Vitals:   09/27/16 1657 09/27/16 1711  BP: (!) 152/94 (!) 146/90  Pulse: 80   Resp: 14   Temp: 98.2 F (36.8 C)   Weight: 194 lb (88 kg)     Physical Exam  Constitutional: He is oriented to person, place, and time. He  appears well-developed and well-nourished.  HENT:  Head: Normocephalic and atraumatic.  Right Ear: External ear normal.  Left Ear: External ear normal.  Nose: Nose normal.  Mouth/Throat: Oropharynx is clear and moist.  Eyes: No scleral icterus.  Neck: No thyromegaly present.  Cardiovascular: Normal rate, regular rhythm and normal heart sounds.   Pulmonary/Chest: Effort normal and breath sounds normal.  Abdominal: Soft.  Genitourinary: Rectum normal, prostate normal and penis normal.  Musculoskeletal: Normal range of motion.  Neurological: He is alert and oriented to person, place, and time.  Skin: Skin is warm and dry.  Psychiatric: He has a normal mood and affect. His behavior is normal. Judgment and thought content normal.     Depression Screen PHQ 2/9 Scores 09/27/2016 09/22/2015 02/25/2015  PHQ - 2 Score 0 0 0  PHQ- 9 Score 0 - -   Assessment & Plan:   1. Annual physical exam - Comprehensive metabolic panel - CBC with Differential/Platelet - Lipid Panel With LDL/HDL Ratio - TSH - POCT Urinalysis Dipstick  2. Colon cancer screening - IFOBT POC (occult bld, rslt in office); Future  3. Prostate cancer screening - PSA  4. Hyperglycemia - HgB A1c   HPI, Exam and A&P transcribed by Theressa Millard, RMA under direction and in the presence of Miguel Aschoff, MD. I have done the exam and reviewed the chart and it is accurate to the best of my knowledge. Development worker, community has been used and  any errors in dictation or transcription are unintentional. Miguel Aschoff M.D. [facility]

## 2016-10-05 LAB — CBC WITH DIFFERENTIAL/PLATELET
Basophils Absolute: 0 10*3/uL (ref 0.0–0.2)
Basos: 0 %
EOS (ABSOLUTE): 0.1 10*3/uL (ref 0.0–0.4)
Eos: 2 %
Hematocrit: 43.7 % (ref 37.5–51.0)
Hemoglobin: 15.1 g/dL (ref 13.0–17.7)
Immature Grans (Abs): 0 10*3/uL (ref 0.0–0.1)
Immature Granulocytes: 0 %
Lymphocytes Absolute: 1.9 10*3/uL (ref 0.7–3.1)
Lymphs: 36 %
MCH: 31.5 pg (ref 26.6–33.0)
MCHC: 34.6 g/dL (ref 31.5–35.7)
MCV: 91 fL (ref 79–97)
Monocytes Absolute: 0.3 10*3/uL (ref 0.1–0.9)
Monocytes: 5 %
Neutrophils Absolute: 2.9 10*3/uL (ref 1.4–7.0)
Neutrophils: 57 %
Platelets: 256 10*3/uL (ref 150–379)
RBC: 4.79 x10E6/uL (ref 4.14–5.80)
RDW: 13.5 % (ref 12.3–15.4)
WBC: 5.1 10*3/uL (ref 3.4–10.8)

## 2016-10-05 LAB — COMPREHENSIVE METABOLIC PANEL
ALT: 12 IU/L (ref 0–44)
AST: 14 IU/L (ref 0–40)
Albumin/Globulin Ratio: 1.9 (ref 1.2–2.2)
Albumin: 4.3 g/dL (ref 3.6–4.8)
Alkaline Phosphatase: 95 IU/L (ref 39–117)
BUN/Creatinine Ratio: 16 (ref 10–24)
BUN: 16 mg/dL (ref 8–27)
Bilirubin Total: 0.3 mg/dL (ref 0.0–1.2)
CO2: 24 mmol/L (ref 20–29)
Calcium: 9.4 mg/dL (ref 8.6–10.2)
Chloride: 103 mmol/L (ref 96–106)
Creatinine, Ser: 0.98 mg/dL (ref 0.76–1.27)
GFR calc Af Amer: 92 mL/min/{1.73_m2} (ref >59)
GFR calc non Af Amer: 80 mL/min/{1.73_m2} (ref >59)
Globulin, Total: 2.3 g/dL (ref 1.5–4.5)
Glucose: 102 mg/dL — ABNORMAL HIGH (ref 65–99)
Potassium: 5.1 mmol/L (ref 3.5–5.2)
Sodium: 140 mmol/L (ref 134–144)
Total Protein: 6.6 g/dL (ref 6.0–8.5)

## 2016-10-05 LAB — HEMOGLOBIN A1C
Est. average glucose Bld gHb Est-mCnc: 114 mg/dL
Hgb A1c MFr Bld: 5.6 % (ref 4.8–5.6)

## 2016-10-05 LAB — LIPID PANEL WITH LDL/HDL RATIO
Cholesterol, Total: 250 mg/dL — ABNORMAL HIGH (ref 100–199)
HDL: 35 mg/dL — ABNORMAL LOW (ref >39)
Triglycerides: 645 mg/dL (ref 0–149)

## 2016-10-05 LAB — PSA: Prostate Specific Ag, Serum: 1 ng/mL (ref 0.0–4.0)

## 2016-10-05 LAB — TSH: TSH: 0.703 u[IU]/mL (ref 0.450–4.500)

## 2016-10-11 ENCOUNTER — Telehealth: Payer: Self-pay

## 2016-10-11 NOTE — Telephone Encounter (Signed)
LMTCB 10/11/2016  Thanks,   -Mickel Baas

## 2016-10-11 NOTE — Telephone Encounter (Signed)
Notes recorded by Arnette Norris on 10/09/2016 at 12:28 PM EDT [id number] ------  Notes recorded by Jerrol Banana., MD on 10/06/2016 at 9:14 PM EDT Work on low fat diet and regular exercise. ------  Notes recorded by Jules Schick, CMA on 10/05/2016 at 2:12 PM EDT Patient advised. He states he has a fish allergy, so unable to take fish oil. Another alternative to fish oil? Please advise. ------  Notes recorded by Jerrol Banana., MD on 10/05/2016 at 8:58 AM EDT Trigs too high--start Fish oil 2 daily.

## 2016-10-13 NOTE — Telephone Encounter (Signed)
Pt advised.   Thanks,   -Elliana Bal  

## 2016-12-24 ENCOUNTER — Other Ambulatory Visit: Payer: Self-pay | Admitting: Family Medicine

## 2017-03-28 ENCOUNTER — Other Ambulatory Visit: Payer: Self-pay | Admitting: Family Medicine

## 2017-03-28 MED ORDER — ESOMEPRAZOLE MAGNESIUM 40 MG PO CPDR: DELAYED_RELEASE_CAPSULE | ORAL | 12 refills | Status: DC

## 2017-03-28 NOTE — Telephone Encounter (Signed)
[facility] called requesting refill on NEXIUM 40 MG capsule. This is the one that was [employer]

## 2017-04-02 ENCOUNTER — Ambulatory Visit: Payer: Commercial Managed Care - PPO | Admitting: Family Medicine

## 2017-04-02 ENCOUNTER — Encounter: Payer: Self-pay | Admitting: Family Medicine

## 2017-04-02 VITALS — BP 138/80 | HR 66 | Temp 98.1°F | Resp 16 | Wt 196.0 lb

## 2017-04-02 DIAGNOSIS — I10 Essential (primary) hypertension: Secondary | ICD-10-CM | POA: Diagnosis not present

## 2017-04-02 DIAGNOSIS — Z23 Encounter for immunization: Secondary | ICD-10-CM

## 2017-04-02 DIAGNOSIS — E7849 Other hyperlipidemia: Secondary | ICD-10-CM

## 2017-04-02 NOTE — Progress Notes (Signed)
Patient: Patrick Long Male    DOB: 12-18-1950   67 y.o.   MRN: [id number] Visit Date: 04/02/2017  Today's Provider: Wilhemena Durie, MD   Chief Complaint  Patient presents with  . Hypertension  . Hyperlipidemia   Subjective:    HPI  Hypertension, follow-up:  BP Readings from Last 3 Encounters:  04/02/17 138/80  09/27/16 (!) 146/90  07/04/16 122/74    He was last seen for hypertension 6 months ago.  BP at that visit was146/90 . Management since that visit includes none. He reports good compliance with treatment. He is not having side effects.  He is not exercising. He is adherent to low salt diet.   Outside blood pressures are running 130's/80-90's. Patient denies chest pain, chest pressure/discomfort, claudication, dyspnea, exertional chest pressure/discomfort, fatigue, irregular heart beat, lower extremity edema, near-syncope, orthopnea, palpitations, paroxysmal nocturnal dyspnea, syncope and tachypnea.    Wt Readings from Last 3 Encounters:  04/02/17 196 lb (88.9 kg)  09/27/16 194 lb (88 kg)  07/04/16 197 lb 12.8 oz (89.7 kg)   ------------------------------------------------------------------------ Pt was unable to start fish oil per las lab note due to fish allergy.       Allergies  Allergen Reactions  . Fish Allergy Nausea And Vomiting    Can not eat any fish with bones     Current Outpatient Medications:  .  aspirin 81 MG tablet, Take 81 mg by mouth daily., Disp: , Rfl:  .  esomeprazole (NEXIUM) 40 MG capsule, take 1 capsule by mouth EVERY OTHER DAY AS DIRECTED, Disp: 30 capsule, Rfl: 12 .  losartan (COZAAR) 100 MG tablet, take 1 tablet by mouth once daily, Disp: 30 tablet, Rfl: 12 .  Multiple Vitamins-Minerals (MULTIVITAMIN ADULT PO), Take 1 tablet by mouth daily. , Disp: , Rfl:   Review of Systems  Constitutional: Negative.   HENT: Negative.   Eyes: Negative.   Respiratory: Negative.   Cardiovascular: Negative.   Gastrointestinal:  Negative.   Endocrine: Negative.   Genitourinary: Negative.   Musculoskeletal: Negative.   Skin: Negative.   Allergic/Immunologic: Negative.   Neurological: Negative.   Hematological: Negative.   Psychiatric/Behavioral: Negative.     Social History   Tobacco Use  . Smoking status: Former Smoker    Years: 10.00    Types: Cigarettes    Last attempt to quit: 02/14/1980    Years since quitting: 37.1  . Smokeless tobacco: Never Used  Substance Use Topics  . Alcohol use: Yes    Alcohol/week: 3.6 oz    Types: 6 Cans of beer per week    Comment:      Objective:   BP 138/80 (BP Location: Left Arm, Patient Position: Sitting, Cuff Size: Large)   Pulse 66   Temp 98.1 F (36.7 C) (Oral)   Resp 16   Wt 196 lb (88.9 kg)   BMI 26.96 kg/m  Vitals:   04/02/17 1620  BP: 138/80  Pulse: 66  Resp: 16  Temp: 98.1 F (36.7 C)  TempSrc: Oral  Weight: 196 lb (88.9 kg)     Physical Exam  Constitutional: He is oriented to person, place, and time. He appears well-developed and well-nourished.  HENT:  Head: Normocephalic and atraumatic.  Eyes: Conjunctivae and EOM are normal. Pupils are equal, round, and reactive to light.  Neck: Normal range of motion. Neck supple.  Cardiovascular: Normal rate, regular rhythm, normal heart sounds and intact distal pulses.  Pulmonary/Chest: Effort normal and breath sounds normal.  Abdominal: Soft.  Musculoskeletal: Normal range of motion.  Neurological: He is alert and oriented to person, place, and time. He has normal reflexes.  Skin: Skin is warm and dry.  Psychiatric: He has a normal mood and affect. His behavior is normal. Judgment and thought content normal.        Assessment & Plan:     1. Essential (primary) hypertension Slightly elevated at home. If still elevated may start Amlodipine 5 mg daily. continue to check BP at home. Follow up in May.   2. Other hyperlipidemia Instructed to eat low fat low cholesterol diet and exercise. Follow  up 3 months with labs. Cant not take fish oil due to fish allergy.    3. Need for pneumococcal vaccination  - Pneumococcal polysaccharide vaccine 23-valent greater than or equal to 2yo subcutaneous/IM      HPI, Exam, and A&P Transcribed under the direction and in the presence of Naomia Lenderman L. [place] Mon, MD  Electronically Signed: Katina Dung, CMA I have done the exam and reviewed the chart and it is accurate to the best of my knowledge. Development worker, community has been used and  any errors in dictation or transcription are unintentional. Miguel Aschoff M.D. [place], MD  [facility]

## 2017-04-02 NOTE — Patient Instructions (Signed)
Anything white eat less of it!   Fat and Cholesterol Restricted Diet Getting too much fat and cholesterol in your diet may cause health problems. Following this diet helps keep your fat and cholesterol at normal levels. This can keep you from getting sick. What types of fat should I choose?  Choose monosaturated and polyunsaturated fats. These are found in foods such as olive oil, canola oil, flaxseeds, walnuts, almonds, and seeds.  Eat more omega-3 fats. Good choices include salmon, mackerel, sardines, tuna, flaxseed oil, and ground flaxseeds.  Limit saturated fats. These are in animal products such as meats, butter, and cream. They can also be in plant products such as palm oil, palm kernel oil, and coconut oil.  Avoid foods with partially hydrogenated oils in them. These contain trans fats. Examples of foods that have trans fats are stick margarine, some tub margarines, cookies, crackers, and other baked goods. What general guidelines do I need to follow?  Check food labels. Look for the words "trans fat" and "saturated fat."  When preparing a meal: ? Fill half of your plate with vegetables and green salads. ? Fill one fourth of your plate with whole grains. Look for the word "whole" as the first word in the ingredient list. ? Fill one fourth of your plate with lean protein foods.  Eat more foods that have fiber, like apples, carrots, beans, peas, and barley.  Eat more home-cooked foods. Eat less at restaurants and buffets.  Limit or avoid alcohol.  Limit foods high in starch and sugar.  Limit fried foods.  Cook foods without frying them. Baking, boiling, grilling, and broiling are all great options.  Lose weight if you are overweight. Losing even a small amount of weight can help your overall health. It can also help prevent diseases such as diabetes and heart disease. What foods can I eat? Grains Whole grains, such as whole wheat or whole grain breads, crackers, cereals,  and pasta. Unsweetened oatmeal, bulgur, barley, quinoa, or brown rice. Corn or whole wheat flour tortillas. Vegetables Fresh or frozen vegetables (raw, steamed, roasted, or grilled). Green salads. Fruits All fresh, canned (in natural juice), or frozen fruits. Meat and Other Protein Products Ground beef (85% or leaner), grass-fed beef, or beef trimmed of fat. Skinless chicken or [place]. Ground chicken or [place]. Pork trimmed of fat. All fish and seafood. Eggs. Dried beans, peas, or lentils. Unsalted nuts or seeds. Unsalted canned or dry beans. Dairy Low-fat dairy products, such as skim or 1% milk, 2% or reduced-fat cheeses, low-fat ricotta or cottage cheese, or plain low-fat yogurt. Fats and Oils Tub margarines without trans fats. Light or reduced-fat mayonnaise and salad dressings. Avocado. Olive, canola, sesame, or safflower oils. Natural peanut or almond butter (choose ones without added sugar and oil). The items listed above may not be a complete list of recommended foods or beverages. Contact your dietitian for more options. What foods are not recommended? Grains White bread. White pasta. White rice. Cornbread. Bagels, pastries, and croissants. Crackers that contain trans fat. Vegetables White potatoes. Corn. Creamed or fried vegetables. Vegetables in a cheese sauce. Fruits Dried fruits. Canned fruit in light or heavy syrup. Fruit juice. Meat and Other Protein Products Fatty cuts of meat. Ribs, chicken wings, bacon, sausage, bologna, salami, chitterlings, fatback, hot dogs, bratwurst, and packaged luncheon meats. Liver and organ meats. Dairy Whole or 2% milk, cream, half-and-half, and cream cheese. Whole milk cheeses. Whole-fat or sweetened yogurt. Full-fat cheeses. Nondairy creamers and whipped toppings. Processed cheese, cheese  spreads, or cheese curds. Sweets and Desserts Corn syrup, sugars, honey, and molasses. Candy. Jam and jelly. Syrup. Sweetened cereals. Cookies, pies, cakes,  donuts, muffins, and ice cream. Fats and Oils Butter, stick margarine, lard, shortening, ghee, or bacon fat. Coconut, palm kernel, or palm oils. Beverages Alcohol. Sweetened drinks (such as sodas, lemonade, and fruit drinks or punches). The items listed above may not be a complete list of foods and beverages to avoid. Contact your dietitian for more information. This information is not intended to replace advice given to you by your health care provider. Make sure you discuss any questions you have with your health care provider. Document Released: 08/01/2011 Document Revised: 10/07/2015 Document Reviewed: 05/01/2013 Elsevier Interactive Patient Education  [employer].

## 2017-07-02 ENCOUNTER — Ambulatory Visit: Payer: Commercial Managed Care - PPO | Admitting: Family Medicine

## 2017-07-02 VITALS — BP 148/90 | HR 68 | Temp 98.7°F | Resp 14 | Wt 187.0 lb

## 2017-07-02 DIAGNOSIS — I10 Essential (primary) hypertension: Secondary | ICD-10-CM | POA: Diagnosis not present

## 2017-07-02 DIAGNOSIS — E7849 Other hyperlipidemia: Secondary | ICD-10-CM | POA: Diagnosis not present

## 2017-07-02 NOTE — Patient Instructions (Signed)
Check BP at home with [facility] Blizzard or [facility] BP cuff

## 2017-07-02 NOTE — Progress Notes (Signed)
Patient: Patrick Long Male    DOB: 1950/09/02   67 y.o.   MRN: [id number] Visit Date: 07/02/2017  Today's Provider: Wilhemena Durie, MD   Chief Complaint  Patient presents with  . Hypertension  . Hyperlipidemia   Subjective:    HPI  Hypertension, follow-up:  BP Readings from Last 3 Encounters:  07/02/17 (!) 148/90  04/02/17 138/80  09/27/16 (!) 146/90    He was last seen for hypertension 3 months ago.  BP at that visit was 138/80. Management since that visit includes none. Elevated last OV, if still elevated may start Amlodipine 5 mg daily. He reports good compliance with treatment. He is not having side effects.  He is not exercising formally, but is more active. He is adherent to low salt diet.   Outside blood pressures are not being checked regularly. Patient denies chest pain, chest pressure/discomfort, claudication, dyspnea, exertional chest pressure/discomfort, fatigue, irregular heart beat, lower extremity edema, near-syncope, orthopnea, palpitations, paroxysmal nocturnal dyspnea, syncope and tachypnea.    Wt Readings from Last 3 Encounters:  07/02/17 187 lb (84.8 kg)  04/02/17 196 lb (88.9 kg)  09/27/16 194 lb (88 kg)    ------------------------------------------------------------------------  Lipid/Cholesterol, Follow-up:   Last seen for this 3 months ago.  Management changes since that visit include none, pt instructed to eat low fat, low cholesterol diet and exercise. Recheck labs in 3 months. . Last Lipid Panel:    Component Value Date/Time   CHOL 250 (H) 10/04/2016 0818   TRIG 645 (HH) 10/04/2016 0818   HDL 35 (L) 10/04/2016 0818   CHOLHDL 6.5 (H) 09/23/2015 0736   [place] Comment 10/04/2016 0818     He reports good compliance with treatment. He is not having side effects.   Wt Readings from Last 3 Encounters:  07/02/17 187 lb (84.8 kg)  04/02/17 196 lb (88.9 kg)  09/27/16 194 lb (88 kg)     -------------------------------------------------------------------      Allergies  Allergen Reactions  . Fish Allergy Nausea And Vomiting    Can not eat any fish with bones     Current Outpatient Medications:  .  aspirin 81 MG tablet, Take 81 mg by mouth daily., Disp: , Rfl:  .  esomeprazole (NEXIUM) 40 MG capsule, take 1 capsule by mouth EVERY OTHER DAY AS DIRECTED, Disp: 30 capsule, Rfl: 12 .  loratadine (CLARITIN) 10 MG tablet, Take 10 mg by mouth daily., Disp: , Rfl:  .  losartan (COZAAR) 100 MG tablet, take 1 tablet by mouth once daily, Disp: 30 tablet, Rfl: 12 .  Multiple Vitamins-Minerals (MULTIVITAMIN ADULT PO), Take 1 tablet by mouth daily. , Disp: , Rfl:   Review of Systems  Constitutional: Negative.   HENT: Negative.   Eyes: Negative.   Respiratory: Negative.   Cardiovascular: Negative.   Gastrointestinal: Negative.   Endocrine: Negative.   Genitourinary: Negative.   Musculoskeletal: Negative.   Skin: Negative.   Allergic/Immunologic: Negative.   Neurological: Negative.   Hematological: Negative.   Psychiatric/Behavioral: Negative.     Social History   Tobacco Use  . Smoking status: Former Smoker    Years: 10.00    Types: Cigarettes    Last attempt to quit: 02/14/1980    Years since quitting: 37.4  . Smokeless tobacco: Never Used  Substance Use Topics  . Alcohol use: Yes    Alcohol/week: 3.6 oz    Types: 6 Cans of beer per week    Comment:  Objective:   BP (!) 148/90 (BP Location: Left Arm, Patient Position: Sitting, Cuff Size: Normal)   Pulse 68   Temp 98.7 F (37.1 C) (Oral)   Resp 14   Wt 187 lb (84.8 kg)   BMI 25.72 kg/m  Vitals:   07/02/17 1613  BP: (!) 148/90  Pulse: 68  Resp: 14  Temp: 98.7 F (37.1 C)  TempSrc: Oral  Weight: 187 lb (84.8 kg)     Physical Exam  Constitutional: He is oriented to person, place, and time. He appears well-developed and well-nourished.  HENT:  Head: Normocephalic and atraumatic.  Eyes:  Pupils are equal, round, and reactive to light. Conjunctivae are normal. No scleral icterus.  Neck: No thyromegaly present.  Cardiovascular: Normal rate, regular rhythm and normal heart sounds.  Pulmonary/Chest: Effort normal and breath sounds normal.  Abdominal: Soft.  Musculoskeletal: He exhibits no edema, tenderness or deformity.  Left knee exam normal.  Neurological: He is alert and oriented to person, place, and time.  Skin: Skin is warm and dry.  Psychiatric: He has a normal mood and affect. His behavior is normal. Judgment and thought content normal.        Assessment & Plan:     1. Essential (primary) hypertension Get BP cuff to check at home.  2. Other hyperlipidemia  - Lipid panel 3.Left knee pain May need ortho referral.     I have done the exam and reviewed the above chart and it is accurate to the best of my knowledge. Development worker, community has been used in this note in any air is in the dictation or transcription are unintentional.  Wilhemena Durie, MD  [place]

## 2017-07-05 LAB — LIPID PANEL
Chol/HDL Ratio: 3.8 ratio (ref 0.0–5.0)
Cholesterol, Total: 173 mg/dL (ref 100–199)
HDL: 46 mg/dL (ref >39)
LDL Calculated: 99 mg/dL (ref 0–99)
Triglycerides: 140 mg/dL (ref 0–149)
VLDL Cholesterol Cal: 28 mg/dL (ref 5–40)

## 2017-07-05 NOTE — Progress Notes (Signed)
Advised 

## 2017-11-06 ENCOUNTER — Encounter: Payer: Self-pay | Admitting: Family Medicine

## 2017-12-12 ENCOUNTER — Ambulatory Visit (INDEPENDENT_AMBULATORY_CARE_PROVIDER_SITE_OTHER): Payer: Commercial Managed Care - PPO | Admitting: Family Medicine

## 2017-12-12 VITALS — BP 140/82 | HR 68 | Temp 98.1°F | Resp 16 | Ht 72.0 in | Wt 187.0 lb

## 2017-12-12 DIAGNOSIS — Z125 Encounter for screening for malignant neoplasm of prostate: Secondary | ICD-10-CM | POA: Diagnosis not present

## 2017-12-12 DIAGNOSIS — Z0000 Encounter for general adult medical examination without abnormal findings: Secondary | ICD-10-CM

## 2017-12-12 NOTE — Progress Notes (Signed)
Patient: Patrick Long, Male    DOB: 12-12-1950, 67 y.o.   MRN: [id number] Visit Date: 12/12/2017  Today's Provider: Wilhemena Durie, MD   Chief Complaint  Patient presents with  . Annual Exam   Subjective:  Patrick Long is a 67 y.o. male who presents today for health maintenance and complete physical. He feels well. He reports exercising twice weekly. He reports he is sleeping well.  11/11/15 Colonoscopy, Wohl-hyperplastic polyp  Review of Systems  Constitutional: Negative.   HENT: Negative.   Eyes: Negative.   Respiratory: Negative.   Cardiovascular: Negative.   Gastrointestinal: Negative.   Endocrine: Negative.   Genitourinary: Negative.   Musculoskeletal: Negative.   Skin: Negative.   Allergic/Immunologic: Negative.   Neurological: Negative.   Hematological: Negative.   Psychiatric/Behavioral: Negative.     Social History   Socioeconomic History  . Marital status: Married    Spouse name: Dorinda  . Number of children: 1  . Years of education: 28  . Highest education level: Not on file  Occupational History  . Occupation: Physiological scientist: [employer]  Social Needs  . Financial resource strain: Not on file  . Food insecurity:    Worry: Not on file    Inability: Not on file  . Transportation needs:    Medical: Not on file    Non-medical: Not on file  Tobacco Use  . Smoking status: Former Smoker    Years: 10.00    Types: Cigarettes    Last attempt to quit: 02/14/1980    Years since quitting: 37.8  . Smokeless tobacco: Never Used  Substance and Sexual Activity  . Alcohol use: Yes    Alcohol/week: 6.0 standard drinks    Types: 6 Cans of beer per week    Comment:     . Drug use: No  . Sexual activity: Yes  Lifestyle  . Physical activity:    Days per week: Not on file    Minutes per session: Not on file  . Stress: Not on file  Relationships  . Social connections:    Talks on phone: Not on file    Gets together: Not on file     Attends religious service: Not on file    Active member of club or organization: Not on file    Attends meetings of clubs or organizations: Not on file    Relationship status: Not on file  . Intimate partner violence:    Fear of current or ex partner: Not on file    Emotionally abused: Not on file    Physically abused: Not on file    Forced sexual activity: Not on file  Other Topics Concern  . Not on file  Social History Narrative  . Not on file    Patient Active Problem List   Diagnosis Date Noted  . Special screening for malignant neoplasms, colon   . Benign neoplasm of sigmoid colon   . Prostate cancer screening 09/22/2015  . Colon cancer screening 09/22/2015  . Actinic keratoses 06/29/2014  . Allergic rhinitis 06/29/2014  . Narrowing of intervertebral disc space 06/29/2014  . Deep vein thrombosis ([place]) 06/29/2014  . Essential (primary) hypertension 06/29/2014  . Acid reflux 06/29/2014  . HLD (hyperlipidemia) 06/29/2014  . Squamous cell carcinoma 06/29/2014  . Gastro-esophageal reflux disease without esophagitis 06/29/2014  . Connective tissue and disc stenosis of intervertebral foramina of abdomen and other regions 06/29/2014    Past Surgical History:  Procedure Laterality Date  .  COLONOSCOPY WITH PROPOFOL N/A 11/11/2015   Procedure: COLONOSCOPY WITH PROPOFOL;  Surgeon: Lucilla Lame, MD;  Location: [facility];  Service: Endoscopy;  Laterality: N/A;  . ORIF TIBIA & FIBULA FRACTURES    . POLYPECTOMY  11/11/2015   Procedure: POLYPECTOMY;  Surgeon: Lucilla Lame, MD;  Location: [facility];  Service: Endoscopy;;  . WISDOM TOOTH EXTRACTION      His family history includes Arrhythmia in his mother; Asthma in his mother; Dementia in his mother; Glaucoma in his sister; Prostate cancer in his father; Stroke in his father.     Outpatient Encounter Medications as of 12/12/2017  Medication Sig Note  . aspirin 81 MG tablet Take 81 mg by mouth daily.   Marland Kitchen esomeprazole  (NEXIUM) 40 MG capsule take 1 capsule by mouth EVERY OTHER DAY AS DIRECTED   . loratadine (CLARITIN) 10 MG tablet Take 10 mg by mouth daily.   Marland Kitchen losartan (COZAAR) 100 MG tablet take 1 tablet by mouth once daily   . Multiple Vitamins-Minerals (MULTIVITAMIN ADULT PO) Take 1 tablet by mouth daily.  06/29/2014: Received from: [facility]   No facility-administered encounter medications on file as of 12/12/2017.     Patient Care Team: Jerrol Banana., MD as PCP - General (Family Medicine)      Objective:   Vitals:  Vitals:   12/12/17 1417  BP: 140/82  Pulse: 68  Resp: 16  Temp: 98.1 F (36.7 C)  TempSrc: Oral  SpO2: 99%  Weight: 187 lb (84.8 kg)  Height: 6' (1.829 m)    Physical Exam  Constitutional: He is oriented to person, place, and time. He appears well-developed and well-nourished.  HENT:  Head: Normocephalic and atraumatic.  Right Ear: External ear normal.  Left Ear: External ear normal.  Nose: Nose normal.  Mouth/Throat: Oropharynx is clear and moist.  Eyes: Pupils are equal, round, and reactive to light. Conjunctivae and EOM are normal.  Neck: Normal range of motion. Neck supple.  Cardiovascular: Normal rate, regular rhythm, normal heart sounds and intact distal pulses.  Pulmonary/Chest: Effort normal and breath sounds normal.  Abdominal: Soft. Bowel sounds are normal.  Genitourinary: Rectum normal, prostate normal and penis normal.  Musculoskeletal: Normal range of motion.  Trace LLE edema since DVT several years ago.  Neurological: He is alert and oriented to person, place, and time.  Skin: Skin is warm and dry.  Psychiatric: He has a normal mood and affect. His behavior is normal. Judgment and thought content normal.     Depression Screen PHQ 2/9 Scores 12/12/2017 09/27/2016 09/22/2015 02/25/2015  PHQ - 2 Score 0 0 0 0  PHQ- 9 Score - 0 - -      Assessment & Plan:     Routine Health Maintenance and Physical Exam  Exercise  Activities and Dietary recommendations Goals   None     Immunization History  Administered Date(s) Administered  . Influenza, Seasonal, Injecte, Preservative Fre 11/23/2015  . Pneumococcal Conjugate-13 09/22/2015  . Pneumococcal Polysaccharide-23 04/02/2017  . Td 11/03/2003  . Tdap 08/05/2013  . Zoster 08/05/2013    Health Maintenance  Topic Date Due  . INFLUENZA VACCINE  09/13/2017  . TETANUS/TDAP  08/06/2023  . COLONOSCOPY  11/10/2025  . Hepatitis C Screening  Completed  . PNA vac Low Risk Adult  Completed     Discussed health benefits of physical activity, and encouraged him to engage in regular exercise appropriate for his age and condition.  RTC 2-3 months for BP  recheck.   I have done the exam and reviewed the chart and it is accurate to the best of my knowledge. Development worker, community has been used and  any errors in dictation or transcription are unintentional. Miguel Aschoff M.D. [facility]

## 2017-12-20 ENCOUNTER — Other Ambulatory Visit: Payer: Self-pay | Admitting: Family Medicine

## 2017-12-26 LAB — CBC WITH DIFFERENTIAL/PLATELET
Basophils Absolute: 0 10*3/uL (ref 0.0–0.2)
Basos: 1 %
EOS (ABSOLUTE): 0.1 10*3/uL (ref 0.0–0.4)
Eos: 1 %
Hematocrit: 42.4 % (ref 37.5–51.0)
Hemoglobin: 14.5 g/dL (ref 13.0–17.7)
Immature Grans (Abs): 0 10*3/uL (ref 0.0–0.1)
Immature Granulocytes: 0 %
Lymphocytes Absolute: 2 10*3/uL (ref 0.7–3.1)
Lymphs: 35 %
MCH: 31 pg (ref 26.6–33.0)
MCHC: 34.2 g/dL (ref 31.5–35.7)
MCV: 91 fL (ref 79–97)
Monocytes Absolute: 0.4 10*3/uL (ref 0.1–0.9)
Monocytes: 7 %
Neutrophils Absolute: 3.1 10*3/uL (ref 1.4–7.0)
Neutrophils: 56 %
Platelets: 295 10*3/uL (ref 150–450)
RBC: 4.67 x10E6/uL (ref 4.14–5.80)
RDW: 12.3 % (ref 12.3–15.4)
WBC: 5.6 10*3/uL (ref 3.4–10.8)

## 2017-12-26 LAB — COMPREHENSIVE METABOLIC PANEL
ALT: 18 IU/L (ref 0–44)
AST: 15 IU/L (ref 0–40)
Albumin/Globulin Ratio: 1.8 (ref 1.2–2.2)
Albumin: 4.2 g/dL (ref 3.6–4.8)
Alkaline Phosphatase: 101 IU/L (ref 39–117)
BUN/Creatinine Ratio: 17 (ref 10–24)
BUN: 18 mg/dL (ref 8–27)
Bilirubin Total: 0.4 mg/dL (ref 0.0–1.2)
CO2: 23 mmol/L (ref 20–29)
Calcium: 9.4 mg/dL (ref 8.6–10.2)
Chloride: 104 mmol/L (ref 96–106)
Creatinine, Ser: 1.04 mg/dL (ref 0.76–1.27)
GFR calc Af Amer: 85 mL/min/{1.73_m2} (ref >59)
GFR calc non Af Amer: 74 mL/min/{1.73_m2} (ref >59)
Globulin, Total: 2.3 g/dL (ref 1.5–4.5)
Glucose: 96 mg/dL (ref 65–99)
Potassium: 4.8 mmol/L (ref 3.5–5.2)
Sodium: 141 mmol/L (ref 134–144)
Total Protein: 6.5 g/dL (ref 6.0–8.5)

## 2017-12-26 LAB — LIPID PANEL WITH LDL/HDL RATIO
Cholesterol, Total: 198 mg/dL (ref 100–199)
HDL: 50 mg/dL (ref >39)
LDL Calculated: 116 mg/dL — ABNORMAL HIGH (ref 0–99)
LDl/HDL Ratio: 2.3 ratio (ref 0.0–3.6)
Triglycerides: 158 mg/dL — ABNORMAL HIGH (ref 0–149)
VLDL Cholesterol Cal: 32 mg/dL (ref 5–40)

## 2017-12-26 LAB — PSA: Prostate Specific Ag, Serum: 0.8 ng/mL (ref 0.0–4.0)

## 2017-12-26 LAB — TSH: TSH: 1 u[IU]/mL (ref 0.450–4.500)

## 2017-12-31 ENCOUNTER — Telehealth: Payer: Self-pay | Admitting: Family Medicine

## 2017-12-31 NOTE — Telephone Encounter (Signed)
LMOVM for pt to return call 

## 2017-12-31 NOTE — Telephone Encounter (Signed)
Pt returning missed call for labs.  Please call pt back.  Thanks, [employer]

## 2017-12-31 NOTE — Telephone Encounter (Signed)
Patient was notified of results. Expressed understanding.  

## 2018-03-14 ENCOUNTER — Ambulatory Visit: Payer: Commercial Managed Care - PPO | Admitting: Family Medicine

## 2018-03-14 ENCOUNTER — Encounter: Payer: Self-pay | Admitting: Family Medicine

## 2018-03-14 VITALS — BP 132/90 | HR 62 | Temp 97.7°F | Resp 16 | Wt 189.0 lb

## 2018-03-14 DIAGNOSIS — I10 Essential (primary) hypertension: Secondary | ICD-10-CM

## 2018-03-14 MED ORDER — AMLODIPINE BESYLATE 5 MG PO TABS: 5.0000 mg | ORAL_TABLET | Freq: Every day | ORAL | 2 refills | Status: DC

## 2018-03-14 NOTE — Progress Notes (Signed)
Patient: Patrick Long Male    DOB: 1950/08/29   68 y.o.   MRN: [id number] Visit Date: 03/14/2018  Today's Provider: Wilhemena Durie, MD   Chief Complaint  Patient presents with  . Follow-up   Subjective:     HPI  Hypertension, follow-up:  BP Readings from Last 3 Encounters:  03/14/18 132/90  12/12/17 140/82  07/02/17 (!) 148/90    He was last seen for hypertension 3 months ago.  BP at that visit was 140/82. Management since that visit includes no changes. He reports good compliance with treatment. He is not having side effects.  He is exercising. He is not adherent to low salt diet.   Outside blood pressures are checked at home and results fluctuate. He is experiencing none.  Patient denies chest pain, chest pressure/discomfort, claudication, dyspnea, exertional chest pressure/discomfort, fatigue, irregular heart beat, lower extremity edema, near-syncope, orthopnea, palpitations, paroxysmal nocturnal dyspnea, syncope and tachypnea.   Cardiovascular risk factors include advanced age (older than 77 for men, 78 for women), hypertension and male gender.  Use of agents associated with hypertension: NSAIDS.     Weight trend: fluctuating a bit Wt Readings from Last 3 Encounters:  03/14/18 189 lb (85.7 kg)  12/12/17 187 lb (84.8 kg)  07/02/17 187 lb (84.8 kg)    Current diet: well balanced  ------------------------------------------------------------------------  Allergies  Allergen Reactions  . Fish Allergy Nausea And Vomiting    Can not eat any fish with bones     Current Outpatient Medications:  .  aspirin 81 MG tablet, Take 81 mg by mouth daily., Disp: , Rfl:  .  esomeprazole (NEXIUM) 40 MG capsule, take 1 capsule by mouth EVERY OTHER DAY AS DIRECTED, Disp: 30 capsule, Rfl: 12 .  loratadine (CLARITIN) 10 MG tablet, Take 10 mg by mouth daily., Disp: , Rfl:  .  losartan (COZAAR) 100 MG tablet, TAKE 1 TABLET BY MOUTH ONCE DAILY, Disp: 30 tablet, Rfl:  11 .  Multiple Vitamins-Minerals (MULTIVITAMIN ADULT PO), Take 1 tablet by mouth daily. , Disp: , Rfl:   Review of Systems  Constitutional: Negative for appetite change, chills and fever.  HENT: Negative.   Eyes: Negative.   Respiratory: Negative for chest tightness, shortness of breath and wheezing.   Cardiovascular: Negative for chest pain and palpitations.  Gastrointestinal: Negative for abdominal pain, nausea and vomiting.  Endocrine: Negative.   Musculoskeletal: Negative.   Allergic/Immunologic: Negative.   Neurological: Negative.   Hematological: Negative.   Psychiatric/Behavioral: Negative.     Social History   Tobacco Use  . Smoking status: Former Smoker    Years: 10.00    Types: Cigarettes    Last attempt to quit: 02/14/1980    Years since quitting: 38.1  . Smokeless tobacco: Never Used  Substance Use Topics  . Alcohol use: Yes    Alcohol/week: 6.0 standard drinks    Types: 6 Cans of beer per week    Comment:         Objective:   BP 132/90 (BP Location: Right Arm, Cuff Size: Large)   Pulse 62   Temp 97.7 F (36.5 C) (Oral)   Resp 16   Wt 189 lb (85.7 kg)   SpO2 99% Comment: room air  BMI 25.63 kg/m  Vitals:   03/14/18 1534 03/14/18 1536  BP: 140/88 132/90  Pulse: 62   Resp: 16   Temp: 97.7 F (36.5 C)   TempSrc: Oral   SpO2: 99%   Weight: 189  lb (85.7 kg)      Physical Exam Constitutional:      Appearance: Normal appearance.  HENT:     Head: Normocephalic and atraumatic.     Right Ear: External ear normal.     Left Ear: External ear normal.     Nose: Nose normal.     Mouth/Throat:     Pharynx: Oropharynx is clear.  Eyes:     General: No scleral icterus.    Conjunctiva/sclera: Conjunctivae normal.  Cardiovascular:     Rate and Rhythm: Normal rate and regular rhythm.     Pulses: Normal pulses.     Heart sounds: Normal heart sounds.  Pulmonary:     Effort: Pulmonary effort is normal.     Breath sounds: Normal breath sounds.  Abdominal:      Palpations: Abdomen is soft.  Skin:    General: Skin is warm and dry.  Neurological:     General: No focal deficit present.     Mental Status: He is alert and oriented to person, place, and time. Mental status is at baseline.  Psychiatric:        Mood and Affect: Mood normal.        Behavior: Behavior normal.        Thought Content: Thought content normal.        Judgment: Judgment normal.         Assessment & Plan    1. Essential (primary) hypertension Uncontrolled. Will add Amlodipine 5mg  daily, and follow up in 2 months.  - amLODipine (NORVASC) 5 MG tablet; Take 1 tablet (5 mg total) by mouth daily.  Dispense: 30 tablet; Refill: 2 2.H/o DVT    Wilhemena Durie, MD  [facility]

## 2018-03-14 NOTE — Patient Instructions (Signed)
Will add Amlodipine 5mg  daily and follow up in 2 months.

## 2018-05-13 ENCOUNTER — Ambulatory Visit: Payer: Self-pay | Admitting: Family Medicine

## 2018-05-15 ENCOUNTER — Other Ambulatory Visit: Payer: Self-pay | Admitting: Family Medicine

## 2018-06-07 ENCOUNTER — Other Ambulatory Visit: Payer: Self-pay | Admitting: Family Medicine

## 2018-06-07 DIAGNOSIS — I10 Essential (primary) hypertension: Secondary | ICD-10-CM

## 2018-06-27 ENCOUNTER — Ambulatory Visit: Payer: Commercial Managed Care - PPO | Admitting: Family Medicine

## 2018-06-27 ENCOUNTER — Encounter: Payer: Self-pay | Admitting: Family Medicine

## 2018-06-27 ENCOUNTER — Other Ambulatory Visit: Payer: Self-pay

## 2018-06-27 VITALS — BP 158/100 | HR 63 | Temp 98.0°F | Ht 72.0 in | Wt 190.6 lb

## 2018-06-27 DIAGNOSIS — J301 Allergic rhinitis due to pollen: Secondary | ICD-10-CM

## 2018-06-27 DIAGNOSIS — I10 Essential (primary) hypertension: Secondary | ICD-10-CM

## 2018-06-27 DIAGNOSIS — E7849 Other hyperlipidemia: Secondary | ICD-10-CM

## 2018-06-27 NOTE — Progress Notes (Signed)
Patient: Patrick Long Male    DOB: 10/08/1950   68 y.o.   MRN: [id number] Visit Date: 06/27/2018  Today's Provider: Wilhemena Durie, MD   Chief Complaint  Patient presents with  . Hypertension    2 month fup, LOV: 03/14/18   Subjective:     HPI   Hypertension, follow-up: BP at home 107-140/70-80. BP Readings from Last 3 Encounters:  06/27/18 (!) 158/100  03/14/18 132/90  12/12/17 140/82    He was last seen for hypertension 4 months ago.  BP at that visit was 132/90. Management since that visit includes added amlodipine 5 mg daily. He reports good compliance with treatment. He is not having side effects.  He is exercising. He is adherent to low salt diet.   Outside blood pressures are 120/80 to 141/85. He is experiencing none.  Patient denies none.   Cardiovascular risk factors include hypertension.  Use of agents associated with hypertension: none.     Weight trend: fluctuating a bit Wt Readings from Last 3 Encounters:  06/27/18 190 lb 9.6 oz (86.5 kg)  03/14/18 189 lb (85.7 kg)  12/12/17 187 lb (84.8 kg)    Current diet: well balanced  ------------------------------------------------------------------------   Allergies  Allergen Reactions  . Fish Allergy Nausea And Vomiting    Can not eat any fish with bones     Current Outpatient Medications:  .  amLODipine (NORVASC) 5 MG tablet, TAKE 1 TABLET(5 MG) BY MOUTH DAILY, Disp: 90 tablet, Rfl: 3 .  aspirin 81 MG tablet, Take 81 mg by mouth daily., Disp: , Rfl:  .  esomeprazole (NEXIUM) 40 MG capsule, TAKE 1 CAPSULE BY MOUTH EVERY OTHER DAY AS DIRECTED, Disp: 30 capsule, Rfl: 12 .  loratadine (CLARITIN) 10 MG tablet, Take 10 mg by mouth daily., Disp: , Rfl:  .  losartan (COZAAR) 100 MG tablet, TAKE 1 TABLET BY MOUTH ONCE DAILY, Disp: 30 tablet, Rfl: 11 .  Multiple Vitamins-Minerals (MULTIVITAMIN ADULT PO), Take 1 tablet by mouth daily. , Disp: , Rfl:   Review of Systems  Constitutional:  Negative.   HENT: Negative.   Eyes: Negative.   Respiratory: Negative.   Cardiovascular: Negative.   Gastrointestinal: Negative.   Endocrine: Negative.   Genitourinary: Negative.   Musculoskeletal: Negative.   Skin: Negative.   Allergic/Immunologic: Negative.   Neurological: Negative.   Hematological: Negative.   Psychiatric/Behavioral: Negative.     Social History   Tobacco Use  . Smoking status: Former Smoker    Years: 10.00    Types: Cigarettes    Last attempt to quit: 02/14/1980    Years since quitting: 38.3  . Smokeless tobacco: Never Used  Substance Use Topics  . Alcohol use: Yes    Alcohol/week: 6.0 standard drinks    Types: 6 Cans of beer per week    Comment:         Objective:   BP (!) 158/100 (BP Location: Right Arm, Patient Position: Sitting, Cuff Size: Normal)   Pulse 63   Temp 98 F (36.7 C) (Oral)   Ht 6' (1.829 m)   Wt 190 lb 9.6 oz (86.5 kg)   SpO2 99%   BMI 25.85 kg/m  Vitals:   06/27/18 0905  BP: (!) 158/100  Pulse: 63  Temp: 98 F (36.7 C)  TempSrc: Oral  SpO2: 99%  Weight: 190 lb 9.6 oz (86.5 kg)  Height: 6' (1.829 m)     Physical Exam Vitals signs reviewed.  Constitutional:  Appearance: He is well-developed.  HENT:     Head: Normocephalic and atraumatic.     Right Ear: External ear normal.     Left Ear: External ear normal.     Nose: Nose normal.  Eyes:     Conjunctiva/sclera: Conjunctivae normal.     Pupils: Pupils are equal, round, and reactive to light.  Neck:     Musculoskeletal: Normal range of motion and neck supple.  Cardiovascular:     Rate and Rhythm: Normal rate and regular rhythm.     Heart sounds: Normal heart sounds.  Pulmonary:     Effort: Pulmonary effort is normal.     Breath sounds: Normal breath sounds.  Abdominal:     General: Bowel sounds are normal.     Palpations: Abdomen is soft.  Genitourinary:    Penis: Normal.      Prostate: Normal.     Rectum: Normal.  Musculoskeletal: Normal range of  motion.     Comments: Trace LLE edema since DVT several years ago.  Skin:    General: Skin is warm and dry.  Neurological:     General: No focal deficit present.     Mental Status: He is alert and oriented to person, place, and time.  Psychiatric:        Mood and Affect: Mood normal.        Behavior: Behavior normal.        Thought Content: Thought content normal.        Judgment: Judgment normal.         Assessment & Plan    1. Essential (primary) hypertension White coat HTN. Controlled on amlodipine and Losartan.RTC 6 months. Discussed following BP ay home. 2. Other hyperlipidemia   3. Seasonal allergic rhinitis due to pollen      Wilhemena Durie, MD  [facility]

## 2018-07-03 ENCOUNTER — Ambulatory Visit: Payer: Self-pay | Admitting: Family Medicine

## 2018-09-24 ENCOUNTER — Ambulatory Visit: Payer: Self-pay | Admitting: Family Medicine

## 2018-12-08 ENCOUNTER — Other Ambulatory Visit: Payer: Self-pay | Admitting: Family Medicine

## 2019-01-29 ENCOUNTER — Encounter: Payer: Self-pay | Admitting: Family Medicine

## 2019-01-29 ENCOUNTER — Ambulatory Visit (INDEPENDENT_AMBULATORY_CARE_PROVIDER_SITE_OTHER): Payer: Commercial Managed Care - PPO | Admitting: Family Medicine

## 2019-01-29 ENCOUNTER — Other Ambulatory Visit: Payer: Self-pay

## 2019-01-29 VITALS — BP 110/69 | HR 71 | Temp 96.8°F | Resp 16 | Ht 72.0 in | Wt 197.0 lb

## 2019-01-29 DIAGNOSIS — I10 Essential (primary) hypertension: Secondary | ICD-10-CM | POA: Diagnosis not present

## 2019-01-29 DIAGNOSIS — Z0000 Encounter for general adult medical examination without abnormal findings: Secondary | ICD-10-CM

## 2019-01-29 DIAGNOSIS — E7849 Other hyperlipidemia: Secondary | ICD-10-CM | POA: Diagnosis not present

## 2019-01-29 DIAGNOSIS — N400 Enlarged prostate without lower urinary tract symptoms: Secondary | ICD-10-CM

## 2019-01-29 DIAGNOSIS — Z1211 Encounter for screening for malignant neoplasm of colon: Secondary | ICD-10-CM

## 2019-01-29 DIAGNOSIS — Z1329 Encounter for screening for other suspected endocrine disorder: Secondary | ICD-10-CM

## 2019-01-29 LAB — POCT URINALYSIS DIPSTICK
Bilirubin, UA: NEGATIVE
Blood, UA: NEGATIVE
Glucose, UA: NEGATIVE
Ketones, UA: NEGATIVE
Leukocytes, UA: NEGATIVE
Nitrite, UA: NEGATIVE
Protein, UA: NEGATIVE
Spec Grav, UA: >=1.03 — AB (ref 1.010–1.025)
Urobilinogen, UA: 0.2 E.U./dL
pH, UA: 6 (ref 5.0–8.0)

## 2019-01-29 LAB — IFOBT (OCCULT BLOOD): IFOBT: NEGATIVE

## 2019-01-29 NOTE — Progress Notes (Signed)
Patient: Patrick Long, Male    DOB: 1950/03/08, 68 y.o.   MRN: [id number] Visit Date: 01/29/2019  Today's Provider: Wilhemena Durie, MD   Chief Complaint  Patient presents with  . Annual Exam   Subjective:     Annual physical exam Iquan Sedam is a 68 y.o. male who presents today for health maintenance and complete physical. He feels well. He reports exercising some. He reports he is sleeping well. Patient is married and is a father of 1 daughter age 38.  No grandchildren.  Retiring from his job in March of next year. -----------------------------------------------------------   Colonoscopy: 11/11/2015  Review of Systems  Constitutional: Negative.   HENT: Negative.   Eyes: Negative.   Respiratory: Negative.   Cardiovascular: Positive for leg swelling.  Gastrointestinal: Negative.   Endocrine: Negative.   Allergic/Immunologic: Negative.   Neurological: Negative.   Hematological: Negative.   Psychiatric/Behavioral: Negative.     Social History      He  reports that he quit smoking about 38 years ago. His smoking use included cigarettes. He quit after 10.00 years of use. He has never used smokeless tobacco. He reports current alcohol use of about 6.0 standard drinks of alcohol per week. He reports that he does not use drugs.       Social History   Socioeconomic History  . Marital status: Married    Spouse name: Dorinda  . Number of children: 1  . Years of education: 9  . Highest education level: Not on file  Occupational History  . Occupation: Physiological scientist: [employer]  Tobacco Use  . Smoking status: Former Smoker    Years: 10.00    Types: Cigarettes    Quit date: 02/14/1980    Years since quitting: 38.9  . Smokeless tobacco: Never Used  Substance and Sexual Activity  . Alcohol use: Yes    Alcohol/week: 6.0 standard drinks    Types: 6 Cans of beer per week    Comment:     . Drug use: No  . Sexual activity: Yes  Other  Topics Concern  . Not on file  Social History Narrative  . Not on file   Social Determinants of Health   Financial Resource Strain:   . Difficulty of Paying Living Expenses: Not on file  Food Insecurity:   . Worried About Charity fundraiser in the Last Year: Not on file  . Ran Out of Food in the Last Year: Not on file  Transportation Needs:   . Lack of Transportation (Medical): Not on file  . Lack of Transportation (Non-Medical): Not on file  Physical Activity:   . Days of Exercise per Week: Not on file  . Minutes of Exercise per Session: Not on file  Stress:   . Feeling of Stress : Not on file  Social Connections:   . Frequency of Communication with Friends and Family: Not on file  . Frequency of Social Gatherings with Friends and Family: Not on file  . Attends Religious Services: Not on file  . Active Member of Clubs or Organizations: Not on file  . Attends Archivist Meetings: Not on file  . Marital Status: Not on file    Past Medical History:  Diagnosis Date  . GERD (gastroesophageal reflux disease)   . Hypertension   . Peripheral vascular disease ([place]) 2015   superficial blood clots - right lower leg     Patient  Active Problem List   Diagnosis Date Noted  . Special screening for malignant neoplasms, colon   . Benign neoplasm of sigmoid colon   . Prostate cancer screening 09/22/2015  . Colon cancer screening 09/22/2015  . Actinic keratoses 06/29/2014  . Allergic rhinitis 06/29/2014  . Narrowing of intervertebral disc space 06/29/2014  . Deep vein thrombosis ([place]) 06/29/2014  . Essential (primary) hypertension 06/29/2014  . Acid reflux 06/29/2014  . HLD (hyperlipidemia) 06/29/2014  . Squamous cell carcinoma 06/29/2014  . Gastro-esophageal reflux disease without esophagitis 06/29/2014  . Connective tissue and disc stenosis of intervertebral foramina of abdomen and other regions 06/29/2014    Past Surgical History:  Procedure Laterality Date  .  COLONOSCOPY WITH PROPOFOL N/A 11/11/2015   Procedure: COLONOSCOPY WITH PROPOFOL;  Surgeon: Lucilla Lame, MD;  Location: [facility];  Service: Endoscopy;  Laterality: N/A;  . ORIF TIBIA & FIBULA FRACTURES    . POLYPECTOMY  11/11/2015   Procedure: POLYPECTOMY;  Surgeon: Lucilla Lame, MD;  Location: [facility];  Service: Endoscopy;;  . WISDOM TOOTH EXTRACTION      Family History        Family Status  Relation Name Status  . Mother  Deceased  . Father  Deceased  . Sister  Alive  . Sister  Alive  . Sister  Alive  . Sister  Alive        His family history includes Arrhythmia in his mother; Asthma in his mother; Dementia in his mother; Glaucoma in his sister; Prostate cancer in his father; Stroke in his father.      Allergies  Allergen Reactions  . Fish Allergy Nausea And Vomiting    Can not eat any fish with bones     Current Outpatient Medications:  .  amLODipine (NORVASC) 5 MG tablet, TAKE 1 TABLET(5 MG) BY MOUTH DAILY, Disp: 90 tablet, Rfl: 3 .  aspirin 81 MG tablet, Take 81 mg by mouth daily., Disp: , Rfl:  .  esomeprazole (NEXIUM) 40 MG capsule, TAKE 1 CAPSULE BY MOUTH EVERY OTHER DAY AS DIRECTED, Disp: 30 capsule, Rfl: 12 .  loratadine (CLARITIN) 10 MG tablet, Take 10 mg by mouth daily., Disp: , Rfl:  .  losartan (COZAAR) 100 MG tablet, TAKE 1 TABLET BY MOUTH ONCE DAILY, Disp: 90 tablet, Rfl: 3 .  Multiple Vitamins-Minerals (MULTIVITAMIN ADULT PO), Take 1 tablet by mouth daily. , Disp: , Rfl:    Patient Care Team: Jerrol Banana., MD as PCP - General (Family Medicine)    Objective:    Vitals: BP 110/69 (BP Location: Right Arm, Patient Position: Sitting, Cuff Size: Large)   Pulse 71   Temp (!) 96.8 F (36 C) (Other (Comment))   Resp 16   Ht 6' (1.829 m)   Wt 197 lb (89.4 kg)   SpO2 97%   BMI 26.72 kg/m    Vitals:   01/29/19 1416  BP: 110/69  Pulse: 71  Resp: 16  Temp: (!) 96.8 F (36 C)  TempSrc: Other (Comment)  SpO2: 97%  Weight:  197 lb (89.4 kg)  Height: 6' (1.829 m)     Physical Exam Vitals reviewed.  Constitutional:      Appearance: He is well-developed.  HENT:     Head: Normocephalic and atraumatic.     Right Ear: External ear normal.     Left Ear: External ear normal.     Nose: Nose normal.  Eyes:     Conjunctiva/sclera: Conjunctivae normal.  Pupils: Pupils are equal, round, and reactive to light.  Cardiovascular:     Rate and Rhythm: Normal rate and regular rhythm.     Heart sounds: Normal heart sounds.  Pulmonary:     Effort: Pulmonary effort is normal.     Breath sounds: Normal breath sounds.  Abdominal:     General: Bowel sounds are normal.     Palpations: Abdomen is soft.  Genitourinary:    Penis: Normal.      Prostate: Normal.     Rectum: Normal.  Musculoskeletal:        General: Normal range of motion.     Cervical back: Normal range of motion and neck supple.     Comments: Trace LLE edema since DVT several years ago.  Skin:    General: Skin is warm and dry.  Neurological:     Mental Status: He is alert and oriented to person, place, and time.  Psychiatric:        Behavior: Behavior normal.        Thought Content: Thought content normal.        Judgment: Judgment normal.      Depression Screen PHQ 2/9 Scores 01/29/2019 06/27/2018 12/12/2017 09/27/2016  PHQ - 2 Score 0 0 0 0  PHQ- 9 Score 0 - - 0       Assessment & Plan:     Routine Health Maintenance and Physical Exam  Exercise Activities and Dietary recommendations Goals   None     Immunization History  Administered Date(s) Administered  . Influenza, High Dose Seasonal PF 11/23/2017  . Influenza, Seasonal, Injecte, Preservative Fre 11/23/2015  . Pneumococcal Conjugate-13 09/22/2015  . Pneumococcal Polysaccharide-23 04/02/2017  . Td 11/03/2003  . Tdap 08/05/2013  . Zoster 08/05/2013    Health Maintenance  Topic Date Due  . INFLUENZA VACCINE  09/14/2018  . TETANUS/TDAP  08/06/2023  . COLONOSCOPY   11/10/2025  . Hepatitis C Screening  Completed  . PNA vac Low Risk Adult  Completed     Discussed health benefits of physical activity, and encouraged him to engage in regular exercise appropriate for his age and condition.    --------------------------------------------------------------------  1. Annual physical exam  - Lipid panel - TSH - PSA - Comprehensive Metabolic Panel (CMET) - CBC w/Diff/Platelet - POCT Urinalysis Dipstick  2. Essential (primary) hypertension  - Comprehensive Metabolic Panel (CMET) - CBC w/Diff/Platelet - POCT Urinalysis Dipstick  3. Other hyperlipidemia  - Lipid panel - POCT Urinalysis Dipstick  4. Benign prostatic hyperplasia, unspecified whether lower urinary tract symptoms present  - PSA  5. Thyroid disorder screen  - TSH  6. Encounter for screening fecal occult blood testing  - IFOBT POC (occult bld, rslt in office) Negative     I,Britton Bera,acting as a scribe for Wilhemena Durie, MD.,have documented all relevant documentation on the behalf of Wilhemena Durie, MD,as directed by  Wilhemena Durie, MD while in the presence of Wilhemena Durie, MD.    Wilhemena Durie, MD  [facility]

## 2019-02-05 LAB — COMPREHENSIVE METABOLIC PANEL
ALT: 15 IU/L (ref 0–44)
AST: 15 IU/L (ref 0–40)
Albumin/Globulin Ratio: 1.5 (ref 1.2–2.2)
Albumin: 3.9 g/dL (ref 3.8–4.8)
Alkaline Phosphatase: 111 IU/L (ref 39–117)
BUN/Creatinine Ratio: 19 (ref 10–24)
BUN: 19 mg/dL (ref 8–27)
Bilirubin Total: 0.4 mg/dL (ref 0.0–1.2)
CO2: 23 mmol/L (ref 20–29)
Calcium: 9.2 mg/dL (ref 8.6–10.2)
Chloride: 102 mmol/L (ref 96–106)
Creatinine, Ser: 1 mg/dL (ref 0.76–1.27)
GFR calc Af Amer: 88 mL/min/{1.73_m2} (ref >59)
GFR calc non Af Amer: 76 mL/min/{1.73_m2} (ref >59)
Globulin, Total: 2.6 g/dL (ref 1.5–4.5)
Glucose: 99 mg/dL (ref 65–99)
Potassium: 4.6 mmol/L (ref 3.5–5.2)
Sodium: 139 mmol/L (ref 134–144)
Total Protein: 6.5 g/dL (ref 6.0–8.5)

## 2019-02-05 LAB — CBC WITH DIFFERENTIAL/PLATELET
Basophils Absolute: 0 10*3/uL (ref 0.0–0.2)
Basos: 1 %
EOS (ABSOLUTE): 0.1 10*3/uL (ref 0.0–0.4)
Eos: 2 %
Hematocrit: 42.7 % (ref 37.5–51.0)
Hemoglobin: 14.5 g/dL (ref 13.0–17.7)
Immature Grans (Abs): 0 10*3/uL (ref 0.0–0.1)
Immature Granulocytes: 0 %
Lymphocytes Absolute: 1.8 10*3/uL (ref 0.7–3.1)
Lymphs: 33 %
MCH: 31.4 pg (ref 26.6–33.0)
MCHC: 34 g/dL (ref 31.5–35.7)
MCV: 92 fL (ref 79–97)
Monocytes Absolute: 0.4 10*3/uL (ref 0.1–0.9)
Monocytes: 7 %
Neutrophils Absolute: 3 10*3/uL (ref 1.4–7.0)
Neutrophils: 57 %
Platelets: 269 10*3/uL (ref 150–450)
RBC: 4.62 x10E6/uL (ref 4.14–5.80)
RDW: 12.4 % (ref 11.6–15.4)
WBC: 5.3 10*3/uL (ref 3.4–10.8)

## 2019-02-05 LAB — LIPID PANEL
Chol/HDL Ratio: 4.7 ratio (ref 0.0–5.0)
Cholesterol, Total: 227 mg/dL — ABNORMAL HIGH (ref 100–199)
HDL: 48 mg/dL (ref >39)
LDL Chol Calc (NIH): 148 mg/dL — ABNORMAL HIGH (ref 0–99)
Triglycerides: 173 mg/dL — ABNORMAL HIGH (ref 0–149)
VLDL Cholesterol Cal: 31 mg/dL (ref 5–40)

## 2019-02-05 LAB — TSH: TSH: 0.658 u[IU]/mL (ref 0.450–4.500)

## 2019-02-05 LAB — PSA: Prostate Specific Ag, Serum: 1.1 ng/mL (ref 0.0–4.0)

## 2019-03-18 ENCOUNTER — Telehealth: Payer: Self-pay | Admitting: Family Medicine

## 2019-03-18 NOTE — Telephone Encounter (Signed)
Copied from [place] [phone]. Topic: Referral - Request for Referral >> Mar 18, 2019 11:32 AM Erick Blinks wrote: Has patient seen PCP for this complaint? Yes.   *If NO, is insurance requiring patient see PCP for this issue before PCP can refer them? Referral for which specialty: Cardiology  Preferred provider/office: Dr. Delana Meyer Reason for referral: Unusual leg sensations, patient is worried

## 2019-03-18 NOTE — Telephone Encounter (Signed)
Please advise referral to cardiology.

## 2019-03-21 ENCOUNTER — Telehealth: Payer: Self-pay

## 2019-03-21 DIAGNOSIS — M79606 Pain in leg, unspecified: Secondary | ICD-10-CM

## 2019-03-21 NOTE — Telephone Encounter (Signed)
Copied from [place] [phone]. Topic: Referral - Request for Referral >> Mar 21, 2019 11:09 AM Rainey Pines A wrote: Patient is requesting a callback from Dr. Marlan Palau nurse with a status update on his caridology referral today. Please advise

## 2019-03-21 NOTE — Telephone Encounter (Signed)
Copied from [place] [phone]. Topic: Referral - Request for Referral >> Mar 18, 2019 11:32 AM Erick Blinks wrote: Has patient seen PCP for this complaint? Yes.   *If NO, is insurance requiring patient see PCP for this issue before PCP can refer them? Referral for which specialty: Cardiology  Preferred provider/office: Dr. Delana Meyer Reason for referral: Unusual leg sensations, patient is worried >> Mar 21, 2019 11:09 AM Rainey Pines A wrote: Patient is requesting a callback from Dr. Marlan Palau nurse with a status update on his caridology referral today. Please advise

## 2019-03-21 NOTE — Telephone Encounter (Signed)
Please advise referral?  

## 2019-03-22 NOTE — Telephone Encounter (Signed)
Ok to refer back to Dr Ubaldo Glassing

## 2019-03-24 NOTE — Telephone Encounter (Signed)
Pt called and stated that he would like referral to go to Dr Delana Meyer. Please advise

## 2019-03-24 NOTE — Addendum Note (Signed)
Addended by: Julieta Bellini on: 03/24/2019 05:17 PM   Modules accepted: Orders

## 2019-03-24 NOTE — Telephone Encounter (Addendum)
Changed referral to vein and vascular. Please cancel cardiology referral. Thank you.

## 2019-03-24 NOTE — Telephone Encounter (Signed)
Referral ordered. Left detailed message advising pt referral has been ordered.

## 2019-03-27 ENCOUNTER — Other Ambulatory Visit: Payer: Self-pay

## 2019-03-27 ENCOUNTER — Other Ambulatory Visit (INDEPENDENT_AMBULATORY_CARE_PROVIDER_SITE_OTHER): Payer: Self-pay | Admitting: Vascular Surgery

## 2019-03-27 ENCOUNTER — Ambulatory Visit (INDEPENDENT_AMBULATORY_CARE_PROVIDER_SITE_OTHER): Payer: Commercial Managed Care - PPO | Admitting: Vascular Surgery

## 2019-03-27 ENCOUNTER — Encounter (INDEPENDENT_AMBULATORY_CARE_PROVIDER_SITE_OTHER): Payer: Self-pay | Admitting: Vascular Surgery

## 2019-03-27 ENCOUNTER — Ambulatory Visit (INDEPENDENT_AMBULATORY_CARE_PROVIDER_SITE_OTHER): Payer: Commercial Managed Care - PPO

## 2019-03-27 VITALS — BP 132/83 | HR 69 | Resp 16 | Wt 195.0 lb

## 2019-03-27 DIAGNOSIS — I10 Essential (primary) hypertension: Secondary | ICD-10-CM

## 2019-03-27 DIAGNOSIS — Z86718 Personal history of other venous thrombosis and embolism: Secondary | ICD-10-CM

## 2019-03-27 DIAGNOSIS — M79604 Pain in right leg: Secondary | ICD-10-CM | POA: Diagnosis not present

## 2019-03-27 DIAGNOSIS — E7849 Other hyperlipidemia: Secondary | ICD-10-CM

## 2019-03-27 DIAGNOSIS — M79661 Pain in right lower leg: Secondary | ICD-10-CM | POA: Diagnosis not present

## 2019-03-27 DIAGNOSIS — M7989 Other specified soft tissue disorders: Secondary | ICD-10-CM

## 2019-03-27 NOTE — Progress Notes (Signed)
MRN : [id number]  Patrick Long is a 69 y.o. (1950-10-17) male who presents with chief complaint of  Chief Complaint  Patient presents with  . New Patient (Initial Visit)    ref. np LLE pain  .  History of Present Illness:   Patient is seen for evaluation of right leg pain and right leg swelling. The patient first noticed the swelling remotely. The swelling is associated with pain and discoloration.  He is  Concerned because the symptoms significantly worsened about two weeks ago after a Dr to [place].   The pain and swelling worsens with prolonged dependency and improves with elevation. The pain is unrelated to activity.  The patient notes that in the morning the legs are significantly improved but they steadily worsened throughout the course of the day. The patient also notes a steady worsening of the discoloration in the ankle and shin area.   The patient denies claudication symptoms.  The patient denies symptoms consistent with rest pain.  The patient denies and extensive history of DJD and LS spine disease.  The patient has no had any past angiography, interventions or vascular surgery.  Elevation makes the leg symptoms better, dependency makes them much worse. There is no history of ulcerations. The patient denies any recent changes in medications.  The patient has not been wearing graduated compression.  The patient has a history of DVT but denies PE. There is no prior history of superficial phlebitis. There is no history of primary lymphedema.  No history of malignancies. No history of trauma or groin or pelvic surgery. There is no history of radiation treatment to the groin or pelvis  The patient denies amaurosis fugax or recent TIA symptoms. There are no recent neurological changes noted. The patient denies recent episodes of angina or shortness of breath  STAT duplex ultrasound is ordered and demonstrates deep venous insufficiency but no acute DVT  Current Meds   Medication Sig  . amLODipine (NORVASC) 5 MG tablet TAKE 1 TABLET(5 MG) BY MOUTH DAILY  . aspirin 81 MG tablet Take 81 mg by mouth daily.  . clobetasol ointment (TEMOVATE) 0.05 % APP EXT AA BID UNTIL GONE  . esomeprazole (NEXIUM) 40 MG capsule TAKE 1 CAPSULE BY MOUTH EVERY OTHER DAY AS DIRECTED  . loratadine (CLARITIN) 10 MG tablet Take 10 mg by mouth daily.  Marland Kitchen losartan (COZAAR) 100 MG tablet TAKE 1 TABLET BY MOUTH ONCE DAILY  . metroNIDAZOLE (METROGEL) 1 % gel APPLY EXTERNALLY TO THE AFFECTED AREA DAILY  . Multiple Vitamins-Minerals (MULTIVITAMIN ADULT PO) Take 1 tablet by mouth daily.     Past Medical History:  Diagnosis Date  . GERD (gastroesophageal reflux disease)   . Hypertension   . Peripheral vascular disease ([place]) 2015   superficial blood clots - right lower leg    Past Surgical History:  Procedure Laterality Date  . COLONOSCOPY WITH PROPOFOL N/A 11/11/2015   Procedure: COLONOSCOPY WITH PROPOFOL;  Surgeon: Lucilla Lame, MD;  Location: [facility];  Service: Endoscopy;  Laterality: N/A;  . ORIF TIBIA & FIBULA FRACTURES    . POLYPECTOMY  11/11/2015   Procedure: POLYPECTOMY;  Surgeon: Lucilla Lame, MD;  Location: [facility];  Service: Endoscopy;;  . WISDOM TOOTH EXTRACTION      Social History Social History   Tobacco Use  . Smoking status: Former Smoker    Years: 10.00    Types: Cigarettes    Quit date: 02/14/1980    Years since quitting: 39.1  .  Smokeless tobacco: Never Used  Substance Use Topics  . Alcohol use: Yes    Alcohol/week: 6.0 standard drinks    Types: 6 Cans of beer per week    Comment:     . Drug use: No    Family History Family History  Problem Relation Age of Onset  . Asthma Mother   . Arrhythmia Mother   . Dementia Mother   . Prostate cancer Father   . Stroke Father   . Glaucoma Sister   No family history of bleeding/clotting disorders, porphyria or autoimmune disease   Allergies  Allergen Reactions  . Fish Allergy Nausea  And Vomiting    Can not eat any fish with bones     REVIEW OF SYSTEMS (Negative unless checked)  Constitutional: [] Weight loss  [] Fever  [] Chills Cardiac: [] Chest pain   [] Chest pressure   [] Palpitations   [] Shortness of breath when laying flat   [] Shortness of breath with exertion. Vascular:  [] Pain in legs with walking   [x] Pain in legs at rest  [x] History of DVT   [] Phlebitis   [x] Swelling in legs   [] Varicose veins   [] Non-healing ulcers Pulmonary:   [] Uses home oxygen   [] Productive cough   [] Hemoptysis   [] Wheeze  [] COPD   [] Asthma Neurologic:  [] Dizziness   [] Seizures   [] History of stroke   [] History of TIA  [] Aphasia   [] Vissual changes   [] Weakness or numbness in arm   [] Weakness or numbness in leg Musculoskeletal:   [] Joint swelling   [] Joint pain   [] Low back pain Hematologic:  [] Easy bruising  [] Easy bleeding   [] Hypercoagulable state   [] Anemic Gastrointestinal:  [] Diarrhea   [] Vomiting  [] Gastroesophageal reflux/heartburn   [] Difficulty swallowing. Genitourinary:  [] Chronic kidney disease   [] Difficult urination  [] Frequent urination   [] Blood in urine Skin:  [] Rashes   [] Ulcers  Psychological:  [] History of anxiety   []  History of major depression.  Physical Examination  Vitals:   03/27/19 0944  BP: 132/83  Pulse: 69  Resp: 16  Weight: 195 lb (88.5 kg)   Body mass index is 26.45 kg/m. Gen: WD/WN, NAD Head: [place]/AT, No temporalis wasting.  Ear/Nose/Throat: Hearing grossly intact, nares w/o erythema or drainage Eyes: PER, EOMI, sclera nonicteric.  Neck: Supple, no large masses.   Pulmonary:  Good air movement, no audible wheezing bilaterally, no use of accessory muscles.  Cardiac: RRR, no JVD Vascular: scattered small varicosities present bilaterally.  Mild venous stasis changes to the legs bilaterally.  2-3+ soft pitting edema right leg trace edema of the left leg Vessel Right Left  Radial Palpable Palpable  PT Palpable Palpable  DP Palpable Palpable   Gastrointestinal: Non-distended. No guarding/no peritoneal signs.  Musculoskeletal: M/S 5/5 throughout.  No deformity or atrophy.  Neurologic: CN 2-12 intact. Symmetrical.  Speech is fluent. Motor exam as listed above. Psychiatric: Judgment intact, Mood & affect appropriate for pt's clinical situation. Dermatologic: No rashes or ulcers noted.  No changes consistent with cellulitis. Lymph : No lichenification or skin changes of chronic lymphedema.  CBC Lab Results  Component Value Date   WBC 5.3 02/04/2019   HGB 14.5 02/04/2019   HCT 42.7 02/04/2019   MCV 92 02/04/2019   PLT 269 02/04/2019    BMET    Component Value Date/Time   NA 139 02/04/2019 0814   K 4.6 02/04/2019 0814   CL 102 02/04/2019 0814   CO2 23 02/04/2019 0814   GLUCOSE 99 02/04/2019 0814   GLUCOSE 117 (  H) 11/03/2014 0812   BUN 19 02/04/2019 0814   CREATININE 1.00 02/04/2019 0814   CALCIUM 9.2 02/04/2019 0814   GFRNONAA 76 02/04/2019 0814   GFRAA 88 02/04/2019 0814   CrCl cannot be calculated (Patient's most recent lab result is older than the maximum 21 days allowed.).  COAG No results found for: INR, PROTIME  Radiology No results found.   Assessment/Plan 1. Pain and swelling of lower leg, right No surgery or intervention at this point in time.  STAT duplex ultrasound is ordered and demonstrates deep venous insufficiency but no acute DVT  I have reviewed my discussion with the patient regarding venous insufficiency and why it causes symptoms. I have discussed with the patient the chronic skin changes that accompany venous insufficiency and the long term sequela such as ulceration. Patient will contnue wearing graduated compression stockings on a daily basis, as this has provided excellent control of his edema. The patient will put the stockings on first thing in the morning and removing them in the evening. The patient is reminded not to sleep in the stockings.  In addition, behavioral modification  including elevation during the day will be initiated. Exercise is strongly encouraged.  Given the patient's good control and lack of any problems regarding the venous insufficiency and lymphedema a lymph pump in not need at this time.  The patient will follow up with me PRN should anything change.  The patient voices agreement with this plan.   2. History of DVT (deep vein thrombosis) No surgery or intervention at this point in time.  I have reviewed my discussion with the patient regarding venous insufficiency and why it causes symptoms. I have discussed with the patient the chronic skin changes that accompany venous insufficiency and the long term sequela such as ulceration. Patient will contnue wearing graduated compression stockings on a daily basis, as this has provided excellent control of his edema. The patient will put the stockings on first thing in the morning and removing them in the evening. The patient is reminded not to sleep in the stockings.  In addition, behavioral modification including elevation during the day will be initiated. Exercise is strongly encouraged.  Given the patient's good control and lack of any problems regarding the venous insufficiency and lymphedema a lymph pump in not need at this time.  The patient will follow up with me PRN should anything change.  The patient voices agreement with this plan.   3. Essential (primary) hypertension Continue antihypertensive medications as already ordered, these medications have been reviewed and there are no changes at this time.   4. Other hyperlipidemia Continue statin as ordered and reviewed, no changes at this time     Hortencia Pilar, MD  03/27/2019 10:04 AM

## 2019-05-29 ENCOUNTER — Other Ambulatory Visit: Payer: Self-pay | Admitting: Family Medicine

## 2019-05-29 DIAGNOSIS — I10 Essential (primary) hypertension: Secondary | ICD-10-CM

## 2019-06-17 DIAGNOSIS — C44622 Squamous cell carcinoma of skin of right upper limb, including shoulder: Secondary | ICD-10-CM | POA: Diagnosis not present

## 2019-07-10 ENCOUNTER — Other Ambulatory Visit: Payer: Self-pay | Admitting: Family Medicine

## 2019-08-24 ENCOUNTER — Other Ambulatory Visit: Payer: Self-pay | Admitting: Family Medicine

## 2019-08-24 DIAGNOSIS — I10 Essential (primary) hypertension: Secondary | ICD-10-CM

## 2019-08-24 NOTE — Telephone Encounter (Signed)
Requested Prescriptions  Pending Prescriptions Disp Refills  . amLODipine (NORVASC) 5 MG tablet [Pharmacy Med Name: AMLODIPINE BESYLATE 5MG  TABLETS] 90 tablet 0    Sig: TAKE 1 TABLET(5 MG) BY MOUTH DAILY     Cardiovascular:  Calcium Channel Blockers Failed - 08/24/2019  3:30 AM      Failed - Valid encounter within last 6 months    Recent Outpatient Visits          6 months ago Annual physical exam   [facility] Jerrol Banana., MD   1 year ago Essential (primary) hypertension   [facility] Jerrol Banana., MD   1 year ago Essential (primary) hypertension   [facility] Jerrol Banana., MD   1 year ago Annual physical exam   [facility] Jerrol Banana., MD   2 years ago Essential (primary) hypertension   [facility] Jerrol Banana., MD      Future Appointments            In 5 months Jerrol Banana., MD [facility]           Passed - Last BP in normal range    BP Readings from Last 1 Encounters:  03/27/19 132/83         Courtesy refill provided per protocol due to patient already having a future appointment scheduled with provider.

## 2019-10-28 DIAGNOSIS — D225 Melanocytic nevi of trunk: Secondary | ICD-10-CM | POA: Diagnosis not present

## 2019-10-28 DIAGNOSIS — D2261 Melanocytic nevi of right upper limb, including shoulder: Secondary | ICD-10-CM | POA: Diagnosis not present

## 2019-10-28 DIAGNOSIS — L738 Other specified follicular disorders: Secondary | ICD-10-CM | POA: Diagnosis not present

## 2019-10-28 DIAGNOSIS — Z85828 Personal history of other malignant neoplasm of skin: Secondary | ICD-10-CM | POA: Diagnosis not present

## 2019-10-28 DIAGNOSIS — D485 Neoplasm of uncertain behavior of skin: Secondary | ICD-10-CM | POA: Diagnosis not present

## 2019-10-28 DIAGNOSIS — D2262 Melanocytic nevi of left upper limb, including shoulder: Secondary | ICD-10-CM | POA: Diagnosis not present

## 2019-10-28 DIAGNOSIS — L570 Actinic keratosis: Secondary | ICD-10-CM | POA: Diagnosis not present

## 2019-10-28 DIAGNOSIS — D2272 Melanocytic nevi of left lower limb, including hip: Secondary | ICD-10-CM | POA: Diagnosis not present

## 2019-11-23 ENCOUNTER — Other Ambulatory Visit: Payer: Self-pay | Admitting: Family Medicine

## 2019-11-23 DIAGNOSIS — I10 Essential (primary) hypertension: Secondary | ICD-10-CM

## 2019-11-23 NOTE — Telephone Encounter (Signed)
Requested medications are due for refill today?  Yes   Requested medications are on active medication list?  Yes  Last Refill:   12/08/2018  # 90 with 3 refills   Future visit scheduled?  Yes   Notes to Clinic:  Medication failed Rx refill protocol due to no valid encounter in the past 6 months and no labs within the past 180 days.  Last visit was 9 months ago and last labs were performed on 02/04/2019.

## 2019-11-23 NOTE — Telephone Encounter (Signed)
Requested Prescriptions  Pending Prescriptions Disp Refills  . losartan (COZAAR) 100 MG tablet [Pharmacy Med Name: LOSARTAN 100MG  TABLETS] 90 tablet 3    Sig: TAKE 1 TABLET BY MOUTH ONCE DAILY     Cardiovascular:  Angiotensin Receptor Blockers Failed - 11/23/2019  6:33 AM      Failed - Cr in normal range and within 180 days    Creatinine, Ser  Date Value Ref Range Status  02/04/2019 1.00 0.76 - 1.27 mg/dL Final         Failed - K in normal range and within 180 days    Potassium  Date Value Ref Range Status  02/04/2019 4.6 3.5 - 5.2 mmol/L Final         Failed - Valid encounter within last 6 months    Recent Outpatient Visits          9 months ago Annual physical exam   [facility] Jerrol Banana., MD   1 year ago Essential (primary) hypertension   [facility] Jerrol Banana., MD   1 year ago Essential (primary) hypertension   [facility] Jerrol Banana., MD   1 year ago Annual physical exam   [facility] Jerrol Banana., MD   2 years ago Essential (primary) hypertension   [facility] Jerrol Banana., MD      Future Appointments            In 2 months Jerrol Banana., MD [facility] [place] - Patient is not pregnant      Passed - Last BP in normal range    BP Readings from Last 1 Encounters:  03/27/19 132/83         . amLODipine (NORVASC) 5 MG tablet [Pharmacy Med Name: AMLODIPINE BESYLATE 5MG  TABLETS] 90 tablet 0    Sig: TAKE 1 TABLET(5 MG) BY MOUTH DAILY     Cardiovascular:  Calcium Channel Blockers Failed - 11/23/2019  6:33 AM      Failed - Valid encounter within last 6 months    Recent Outpatient Visits          9 months ago Annual physical exam   [facility] Jerrol Banana., MD   1 year ago Essential (primary) hypertension   [facility] Jerrol Banana., MD   1 year  ago Essential (primary) hypertension   [facility] Jerrol Banana., MD   1 year ago Annual physical exam   [facility] Jerrol Banana., MD   2 years ago Essential (primary) hypertension   [facility] Jerrol Banana., MD      Future Appointments            In 2 months Jerrol Banana., MD [facility]           Passed - Last BP in normal range    BP Readings from Last 1 Encounters:  03/27/19 132/83         Courtesy refill.  Patient must keep upcoming appointment for further refills.

## 2020-01-29 ENCOUNTER — Encounter: Payer: Self-pay | Admitting: Family Medicine

## 2020-01-29 ENCOUNTER — Other Ambulatory Visit: Payer: Self-pay

## 2020-01-29 ENCOUNTER — Ambulatory Visit (INDEPENDENT_AMBULATORY_CARE_PROVIDER_SITE_OTHER): Payer: Medicare Other | Admitting: Family Medicine

## 2020-01-29 VITALS — BP 131/89 | HR 63 | Temp 98.6°F | Resp 16 | Ht 72.0 in | Wt 196.0 lb

## 2020-01-29 DIAGNOSIS — Z0000 Encounter for general adult medical examination without abnormal findings: Secondary | ICD-10-CM | POA: Diagnosis not present

## 2020-01-29 DIAGNOSIS — I10 Essential (primary) hypertension: Secondary | ICD-10-CM | POA: Diagnosis not present

## 2020-01-29 DIAGNOSIS — N400 Enlarged prostate without lower urinary tract symptoms: Secondary | ICD-10-CM | POA: Diagnosis not present

## 2020-01-29 DIAGNOSIS — E7849 Other hyperlipidemia: Secondary | ICD-10-CM | POA: Diagnosis not present

## 2020-01-29 DIAGNOSIS — Z1329 Encounter for screening for other suspected endocrine disorder: Secondary | ICD-10-CM

## 2020-01-29 DIAGNOSIS — Z1211 Encounter for screening for malignant neoplasm of colon: Secondary | ICD-10-CM

## 2020-01-29 NOTE — Progress Notes (Deleted)
Complete physical exam   Patient: Patrick Long   DOB: [birth year]   69 y.o. Male  MRN: [id number] Visit Date: 01/29/2020  Today's healthcare provider: Wilhemena Durie, MD   No chief complaint on file.  Subjective    Patrick Long is a 69 y.o. male who presents today for a complete physical exam.  He reports consuming a {diet types:17450} diet. {Exercise:19826} He generally feels {well/fairly well/poorly:18703}. He reports sleeping {well/fairly well/poorly:18703}. He {does/does not:200015} have additional problems to discuss today.  HPI    Past Medical History:  Diagnosis Date  . GERD (gastroesophageal reflux disease)   . Hypertension   . Peripheral vascular disease ([place]) 2015   superficial blood clots - right lower leg   Past Surgical History:  Procedure Laterality Date  . COLONOSCOPY WITH PROPOFOL N/A 11/11/2015   Procedure: COLONOSCOPY WITH PROPOFOL;  Surgeon: Lucilla Lame, MD;  Location: [place];  Service: Endoscopy;  Laterality: N/A;  . ORIF TIBIA & FIBULA FRACTURES    . POLYPECTOMY  11/11/2015   Procedure: POLYPECTOMY;  Surgeon: Lucilla Lame, MD;  Location: [place];  Service: Endoscopy;;  . WISDOM TOOTH EXTRACTION     Social History   Socioeconomic History  . Marital status: Married    Spouse name: Dorinda  . Number of children: 1  . Years of education: 1  . Highest education level: Not on file  Occupational History  . Occupation: Physiological scientist: [employer]  Tobacco Use  . Smoking status: Former Smoker    Years: 10.00    Types: Cigarettes    Quit date: 02/14/1980    Years since quitting: 39.9  . Smokeless tobacco: Never Used  Substance and Sexual Activity  . Alcohol use: Yes    Alcohol/week: 6.0 standard drinks    Types: 6 Cans of beer per week    Comment:     . Drug use: No  . Sexual activity: Yes  Other Topics Concern  . Not on file  Social History Narrative  . Not on file   Social Determinants of  Health   Financial Resource Strain: Not on file  Food Insecurity: Not on file  Transportation Needs: Not on file  Physical Activity: Not on file  Stress: Not on file  Social Connections: Not on file  Intimate Partner Violence: Not on file   Family Status  Relation Name Status  . Mother  Deceased  . Father  Deceased  . Sister  Alive  . Sister  Alive  . Sister  Alive  . Sister  Alive   Family History  Problem Relation Age of Onset  . Asthma Mother   . Arrhythmia Mother   . Dementia Mother   . Prostate cancer Father   . Stroke Father   . Glaucoma Sister    Allergies  Allergen Reactions  . Fish Allergy Nausea And Vomiting    Can not eat any fish with bones    Patient Care Team: Jerrol Banana., MD as PCP - General (Family Medicine)   Medications: Outpatient Medications Prior to Visit  Medication Sig  . amLODipine (NORVASC) 5 MG tablet TAKE 1 TABLET(5 MG) BY MOUTH DAILY  . aspirin 81 MG tablet Take 81 mg by mouth daily.  . clobetasol ointment (TEMOVATE) 0.05 % APP EXT AA BID UNTIL GONE  . esomeprazole (NEXIUM) 40 MG capsule TAKE 1 CAPSULE BY MOUTH EVERY OTHER DAY AS DIRECTED  . loratadine (CLARITIN) 10  MG tablet Take 10 mg by mouth daily.  Marland Kitchen losartan (COZAAR) 100 MG tablet TAKE 1 TABLET BY MOUTH ONCE DAILY  . metroNIDAZOLE (METROGEL) 1 % gel APPLY EXTERNALLY TO THE AFFECTED AREA DAILY  . Multiple Vitamins-Minerals (MULTIVITAMIN ADULT PO) Take 1 tablet by mouth daily.    No facility-administered medications prior to visit.    Review of Systems  Constitutional: Negative for appetite change, chills, fatigue and fever.  HENT: Negative for congestion, ear pain, hearing loss, nosebleeds and trouble swallowing.   Eyes: Negative for pain and visual disturbance.  Respiratory: Negative for cough, chest tightness and shortness of breath.   Cardiovascular: Negative for chest pain, palpitations and leg swelling.  Gastrointestinal: Negative for abdominal pain, blood in  stool, constipation, diarrhea, nausea and vomiting.  Endocrine: Negative for polydipsia, polyphagia and polyuria.  Genitourinary: Negative for dysuria and flank pain.  Musculoskeletal: Negative for arthralgias, back pain, joint swelling, myalgias and neck stiffness.  Skin: Negative for color change, rash and wound.  Neurological: Negative for dizziness, tremors, seizures, speech difficulty, weakness, light-headedness and headaches.  Psychiatric/Behavioral: Negative for behavioral problems, confusion, decreased concentration, dysphoric mood and sleep disturbance. The patient is not nervous/anxious.   All other systems reviewed and are negative.   {Heme  Chem  Endocrine  Serology  Results Review (optional):23779::" "}  Objective    There were no vitals taken for this visit. {Show previous vital signs (optional):23777::" "}  Physical Exam  ***  Last depression screening scores PHQ 2/9 Scores 01/29/2019 06/27/2018 12/12/2017  PHQ - 2 Score 0 0 0  PHQ- 9 Score 0 - -   Last fall risk screening Fall Risk  01/29/2019  Falls in the past year? 0  Number falls in past yr: 0  Injury with Fall? 0  Follow up Falls evaluation completed   Last Audit-C alcohol use screening Alcohol Use Disorder Test (AUDIT) 01/29/2019  1. How often do you have a drink containing alcohol? 3  2. How many drinks containing alcohol do you have on a typical day when you are drinking? 0  3. How often do you have six or more drinks on one occasion? 0  AUDIT-C Score 3   A score of 3 or more in women, and 4 or more in men indicates increased risk for alcohol abuse, EXCEPT if all of the points are from question 1   No results found for any visits on 01/29/20.  Assessment & Plan    Routine Health Maintenance and Physical Exam  Exercise Activities and Dietary recommendations Goals   None     Immunization History  Administered Date(s) Administered  . Fluad Quad(high Dose 65+) 11/28/2018  . Influenza, High  Dose Seasonal PF 11/23/2017  . Influenza, Seasonal, Injecte, Preservative Fre 11/23/2015  . Pneumococcal Conjugate-13 09/22/2015  . Pneumococcal Polysaccharide-23 04/02/2017  . Td 11/03/2003  . Tdap 08/05/2013  . Zoster 08/05/2013    Health Maintenance  Topic Date Due  . COVID-19 Vaccine (1) Never done  . INFLUENZA VACCINE  09/14/2019  . TETANUS/TDAP  08/06/2023  . COLONOSCOPY  11/10/2025  . Hepatitis C Screening  Completed  . PNA vac Low Risk Adult  Completed    Discussed health benefits of physical activity, and encouraged him to engage in regular exercise appropriate for his age and condition.  ***  No follow-ups on file.     {provider attestation***:1}   Wilhemena Durie, MD  [facility] [phone] (phone) [phone] (fax)  [place]

## 2020-01-29 NOTE — Progress Notes (Signed)
Medicare Initial Preventative Physical Exam   I,April Miller,acting as a scribe for Wilhemena Durie, MD.,have documented all relevant documentation on the behalf of Wilhemena Durie, MD,as directed by  Wilhemena Durie, MD while in the presence of Wilhemena Durie, MD.    Patient: Patrick Long, Male    DOB: 04/22/1950, 69 y.o.   MRN: [id number] Visit Date: 01/29/2020  Today's Provider: Wilhemena Durie, MD   Subjective:    Chief Complaint  Patient presents with  . Medicare Wellness    Medicare Initial Preventative Physical Exam Patrick Long is a 69 y.o. male who presents today for his Initial Preventative Physical Exam.  Feels well and has no complaints.  He retired as of May 15, 2019.  He is married and is a father of 1 daughter.  She lives in [place]. HPI  Social History   Socioeconomic History  . Marital status: Married    Spouse name: Dorinda  . Number of children: 1  . Years of education: 80  . Highest education level: Not on file  Occupational History  . Occupation: Physiological scientist: [employer]  Tobacco Use  . Smoking status: Former Smoker    Years: 10.00    Types: Cigarettes    Quit date: 02/14/1980    Years since quitting: 39.9  . Smokeless tobacco: Never Used  Substance and Sexual Activity  . Alcohol use: Yes    Alcohol/week: 6.0 standard drinks    Types: 6 Cans of beer per week    Comment:     . Drug use: No  . Sexual activity: Yes  Other Topics Concern  . Not on file  Social History Narrative  . Not on file   Social Determinants of Health   Financial Resource Strain: Not on file  Food Insecurity: Not on file  Transportation Needs: Not on file  Physical Activity: Not on file  Stress: Not on file  Social Connections: Not on file  Intimate Partner Violence: Not on file    Past Medical History:  Diagnosis Date  . GERD (gastroesophageal reflux disease)   . Hypertension   . Peripheral vascular disease ([place])  2015   superficial blood clots - right lower leg     Patient Active Problem List   Diagnosis Date Noted  . Pain and swelling of lower leg, right 03/27/2019  . Special screening for malignant neoplasms, colon   . Benign neoplasm of sigmoid colon   . Prostate cancer screening 09/22/2015  . Colon cancer screening 09/22/2015  . Actinic keratoses 06/29/2014  . Allergic rhinitis 06/29/2014  . Narrowing of intervertebral disc space 06/29/2014  . History of DVT (deep vein thrombosis) 06/29/2014  . Essential (primary) hypertension 06/29/2014  . Acid reflux 06/29/2014  . HLD (hyperlipidemia) 06/29/2014  . Squamous cell carcinoma 06/29/2014  . Gastro-esophageal reflux disease without esophagitis 06/29/2014  . Connective tissue and disc stenosis of intervertebral foramina of abdomen and other regions 06/29/2014    Past Surgical History:  Procedure Laterality Date  . COLONOSCOPY WITH PROPOFOL N/A 11/11/2015   Procedure: COLONOSCOPY WITH PROPOFOL;  Surgeon: Lucilla Lame, MD;  Location: [place];  Service: Endoscopy;  Laterality: N/A;  . ORIF TIBIA & FIBULA FRACTURES    . POLYPECTOMY  11/11/2015   Procedure: POLYPECTOMY;  Surgeon: Lucilla Lame, MD;  Location: [place];  Service: Endoscopy;;  . WISDOM TOOTH EXTRACTION      His family history includes Arrhythmia in his  mother; Asthma in his mother; Dementia in his mother; Glaucoma in his sister; Prostate cancer in his father; Stroke in his father.   Current Outpatient Medications:  .  amLODipine (NORVASC) 5 MG tablet, TAKE 1 TABLET(5 MG) BY MOUTH DAILY, Disp: 90 tablet, Rfl: 0 .  aspirin 81 MG tablet, Take 81 mg by mouth daily., Disp: , Rfl:  .  esomeprazole (NEXIUM) 40 MG capsule, TAKE 1 CAPSULE BY MOUTH EVERY OTHER DAY AS DIRECTED, Disp: 30 capsule, Rfl: 12 .  loratadine (CLARITIN) 10 MG tablet, Take 10 mg by mouth daily., Disp: , Rfl:  .  losartan (COZAAR) 100 MG tablet, TAKE 1 TABLET BY MOUTH ONCE DAILY, Disp: 90 tablet,  Rfl: 3 .  metroNIDAZOLE (METROGEL) 1 % gel, APPLY EXTERNALLY TO THE AFFECTED AREA DAILY, Disp: , Rfl:  .  Multiple Vitamins-Minerals (MULTIVITAMIN ADULT PO), Take 1 tablet by mouth daily. , Disp: , Rfl:  .  clobetasol ointment (TEMOVATE) 0.05 %, APP EXT AA BID UNTIL GONE (Patient not taking: Reported on 01/29/2020), Disp: , Rfl:    Patient Care Team: Jerrol Banana., MD as PCP - General (Family Medicine)  Review of Systems  Musculoskeletal: Positive for arthralgias, back pain and neck stiffness.  Allergic/Immunologic: Positive for food allergies.        Objective:    Vitals: BP 131/89 (BP Location: Right Arm, Patient Position: Sitting, Cuff Size: Large)   Pulse 63   Temp 98.6 F (37 C) (Oral)   Resp 16   Ht 6' (1.829 m)   Wt 196 lb (88.9 kg)   SpO2 100%   BMI 26.58 kg/m   Hearing Screening   125Hz  250Hz  500Hz  1000Hz  2000Hz  3000Hz  4000Hz  6000Hz  8000Hz   Right ear:           Left ear:             Visual Acuity Screening   Right eye Left eye Both eyes  Without correction:     With correction: 20/25 20/20 20/25    Physical Exam Vitals reviewed.  Constitutional:      Appearance: He is well-developed.  HENT:     Head: Normocephalic and atraumatic.     Right Ear: External ear normal.     Left Ear: External ear normal.     Nose: Nose normal.  Eyes:     Conjunctiva/sclera: Conjunctivae normal.     Pupils: Pupils are equal, round, and reactive to light.  Cardiovascular:     Rate and Rhythm: Normal rate and regular rhythm.     Heart sounds: Normal heart sounds.  Pulmonary:     Effort: Pulmonary effort is normal.     Breath sounds: Normal breath sounds.  Abdominal:     General: Bowel sounds are normal.     Palpations: Abdomen is soft.  Genitourinary:    Penis: Normal.      Testes: Normal.     Prostate: Normal.     Rectum: Normal.  Musculoskeletal:        General: Normal range of motion.     Cervical back: Normal range of motion and neck supple.     Comments:  Trace LLE edema since DVT several years ago.  Skin:    General: Skin is warm and dry.     Comments: Some AK's of the head and face.  Neurological:     Mental Status: He is alert and oriented to person, place, and time.  Psychiatric:        Behavior: Behavior normal.  Thought Content: Thought content normal.        Judgment: Judgment normal.    ECG reveals normal sinus rhythm rate of 64 without ischemic changes.  Activities of Daily Living In your present state of health, do you have any difficulty performing the following activities: 01/29/2020 01/29/2019  Hearing? N N  Vision? N N  Difficulty concentrating or making decisions? N N  Walking or climbing stairs? N N  Dressing or bathing? N N  Doing errands, shopping? N N  Some recent data might be hidden    Fall Risk Assessment Fall Risk  01/29/2020 01/29/2019 06/27/2018 12/12/2017 09/27/2016  Falls in the past year? 0 0 0 No No  Number falls in past yr: 0 0 - - -  Injury with Fall? 0 0 - - -  Follow up Falls evaluation completed Falls evaluation completed - - -     Depression Screen PHQ 2/9 Scores 01/29/2020 01/29/2019 06/27/2018 12/12/2017  PHQ - 2 Score 0 0 0 0  PHQ- 9 Score 0 0 - -    Hearing Screening   125Hz  250Hz  500Hz  1000Hz  2000Hz  3000Hz  4000Hz  6000Hz  8000Hz   Right ear:           Left ear:             Visual Acuity Screening   Right eye Left eye Both eyes  Without correction:     With correction: 20/25 20/20 20/25      No flowsheet data found.    Assessment & Plan:    Initial Preventative Physical Exam  Reviewed patient's Family Medical History Reviewed and updated list of patient's medical providers Assessment of cognitive impairment was done Assessed patient's functional ability Established a written schedule for health screening [place] Completed and Reviewed  Exercise Activities and Dietary recommendations Goals   None     Immunization History  Administered  Date(s) Administered  . Fluad Quad(high Dose 65+) 11/28/2018  . Influenza, High Dose Seasonal PF 11/23/2017  . Influenza, Seasonal, Injecte, Preservative Fre 11/23/2015  . Pneumococcal Conjugate-13 09/22/2015  . Pneumococcal Polysaccharide-23 04/02/2017  . Td 11/03/2003  . Tdap 08/05/2013  . Zoster 08/05/2013    Health Maintenance  Topic Date Due  . COVID-19 Vaccine (1) Never done  . INFLUENZA VACCINE  09/14/2019  . TETANUS/TDAP  08/06/2023  . COLONOSCOPY  11/10/2025  . Hepatitis C Screening  Completed  . PNA vac Low Risk Adult  Completed     Discussed health benefits of physical activity, and encouraged him to engage in regular exercise appropriate for his age and condition.   1. Annual physical exam/welcome to Medicare exam  - EKG 12-Lead  2. Essential (primary) hypertension   3. Other hyperlipidemia   4. Benign prostatic hyperplasia, unspecified whether lower urinary tract symptoms present   5. Thyroid disorder screen   6. Encounter for screening fecal occult blood testing  - IFOBT POC (occult bld, rslt in office);--  Follow up in 3 months.      Joncarlos Atkison Cranford Mon, MD  [facility] [phone] (phone) [phone] (fax)  [place]

## 2020-02-04 DIAGNOSIS — H2513 Age-related nuclear cataract, bilateral: Secondary | ICD-10-CM | POA: Diagnosis not present

## 2020-02-21 ENCOUNTER — Other Ambulatory Visit: Payer: Self-pay | Admitting: Family Medicine

## 2020-02-21 DIAGNOSIS — I10 Essential (primary) hypertension: Secondary | ICD-10-CM

## 2020-03-02 ENCOUNTER — Emergency Department (HOSPITAL_COMMUNITY): Payer: Medicare Other

## 2020-03-02 ENCOUNTER — Emergency Department (HOSPITAL_COMMUNITY)
Admission: EM | Admit: 2020-03-02 | Discharge: 2020-03-02 | Disposition: A | Discharge status: Home / Self Care | Payer: Medicare Other | Attending: Emergency Medicine | Admitting: Emergency Medicine

## 2020-03-02 ENCOUNTER — Other Ambulatory Visit: Payer: Self-pay

## 2020-03-02 DIAGNOSIS — S2241XA Multiple fractures of ribs, right side, initial encounter for closed fracture: Secondary | ICD-10-CM

## 2020-03-02 DIAGNOSIS — S43005A Unspecified dislocation of left shoulder joint, initial encounter: Secondary | ICD-10-CM

## 2020-03-02 DIAGNOSIS — W5522XA Struck by cow, initial encounter: Secondary | ICD-10-CM

## 2020-03-02 DIAGNOSIS — W19XXXA Unspecified fall, initial encounter: Secondary | ICD-10-CM

## 2020-03-02 DIAGNOSIS — Z7982 Long term (current) use of aspirin: Secondary | ICD-10-CM | POA: Insufficient documentation

## 2020-03-02 DIAGNOSIS — Z743 Need for continuous supervision: Secondary | ICD-10-CM | POA: Diagnosis not present

## 2020-03-02 DIAGNOSIS — M79651 Pain in right thigh: Secondary | ICD-10-CM | POA: Insufficient documentation

## 2020-03-02 DIAGNOSIS — R0902 Hypoxemia: Secondary | ICD-10-CM | POA: Diagnosis not present

## 2020-03-02 DIAGNOSIS — M25519 Pain in unspecified shoulder: Secondary | ICD-10-CM | POA: Diagnosis not present

## 2020-03-02 DIAGNOSIS — S199XXA Unspecified injury of neck, initial encounter: Secondary | ICD-10-CM | POA: Diagnosis not present

## 2020-03-02 DIAGNOSIS — I10 Essential (primary) hypertension: Secondary | ICD-10-CM | POA: Insufficient documentation

## 2020-03-02 DIAGNOSIS — S3993XA Unspecified injury of pelvis, initial encounter: Secondary | ICD-10-CM | POA: Diagnosis not present

## 2020-03-02 DIAGNOSIS — S3991XA Unspecified injury of abdomen, initial encounter: Secondary | ICD-10-CM | POA: Insufficient documentation

## 2020-03-02 DIAGNOSIS — S0990XA Unspecified injury of head, initial encounter: Secondary | ICD-10-CM | POA: Diagnosis not present

## 2020-03-02 DIAGNOSIS — Z20822 Contact with and (suspected) exposure to covid-19: Secondary | ICD-10-CM | POA: Diagnosis not present

## 2020-03-02 DIAGNOSIS — I700 Atherosclerosis of aorta: Secondary | ICD-10-CM | POA: Insufficient documentation

## 2020-03-02 DIAGNOSIS — R42 Dizziness and giddiness: Secondary | ICD-10-CM | POA: Insufficient documentation

## 2020-03-02 DIAGNOSIS — Z79899 Other long term (current) drug therapy: Secondary | ICD-10-CM | POA: Diagnosis not present

## 2020-03-02 DIAGNOSIS — S43015A Anterior dislocation of left humerus, initial encounter: Secondary | ICD-10-CM | POA: Diagnosis not present

## 2020-03-02 DIAGNOSIS — S4992XA Unspecified injury of left shoulder and upper arm, initial encounter: Secondary | ICD-10-CM | POA: Diagnosis not present

## 2020-03-02 DIAGNOSIS — R6889 Other general symptoms and signs: Secondary | ICD-10-CM | POA: Diagnosis not present

## 2020-03-02 DIAGNOSIS — S7011XA Contusion of right thigh, initial encounter: Secondary | ICD-10-CM | POA: Diagnosis not present

## 2020-03-02 LAB — COMPREHENSIVE METABOLIC PANEL
ALT: 26 U/L (ref 0–44)
AST: 41 U/L (ref 15–41)
Albumin: 3.8 g/dL (ref 3.5–5.0)
Alkaline Phosphatase: 88 U/L (ref 38–126)
Anion gap: 9 (ref 5–15)
BUN: 16 mg/dL (ref 8–23)
CO2: 23 mmol/L (ref 22–32)
Calcium: 9 mg/dL (ref 8.9–10.3)
Chloride: 104 mmol/L (ref 98–111)
Creatinine, Ser: 1.05 mg/dL (ref 0.61–1.24)
GFR, Estimated: >60 mL/min (ref >60)
Glucose, Bld: 141 mg/dL — ABNORMAL HIGH (ref 70–99)
Potassium: 5.2 mmol/L — ABNORMAL HIGH (ref 3.5–5.1)
Sodium: 136 mmol/L (ref 135–145)
Total Bilirubin: 1.1 mg/dL (ref 0.3–1.2)
Total Protein: 6.4 g/dL — ABNORMAL LOW (ref 6.5–8.1)

## 2020-03-02 LAB — CBC
HCT: 45 % (ref 39.0–52.0)
Hemoglobin: 14.6 g/dL (ref 13.0–17.0)
MCH: 30.5 pg (ref 26.0–34.0)
MCHC: 32.4 g/dL (ref 30.0–36.0)
MCV: 93.9 fL (ref 80.0–100.0)
Platelets: 279 10*3/uL (ref 150–400)
RBC: 4.79 MIL/uL (ref 4.22–5.81)
RDW: 12.4 % (ref 11.5–15.5)
WBC: 12.5 10*3/uL — ABNORMAL HIGH (ref 4.0–10.5)
nRBC: 0 % (ref 0.0–0.2)

## 2020-03-02 LAB — SAMPLE TO BLOOD BANK

## 2020-03-02 LAB — I-STAT CHEM 8, ED
BUN: 18 mg/dL (ref 8–23)
Calcium, Ion: 1.16 mmol/L (ref 1.15–1.40)
Chloride: 105 mmol/L (ref 98–111)
Creatinine, Ser: 1 mg/dL (ref 0.61–1.24)
Glucose, Bld: 135 mg/dL — ABNORMAL HIGH (ref 70–99)
HCT: 42 % (ref 39.0–52.0)
Hemoglobin: 14.3 g/dL (ref 13.0–17.0)
Potassium: 4.5 mmol/L (ref 3.5–5.1)
Sodium: 141 mmol/L (ref 135–145)
TCO2: 24 mmol/L (ref 22–32)

## 2020-03-02 LAB — CBG MONITORING, ED: Glucose-Capillary: 90 mg/dL (ref 70–99)

## 2020-03-02 LAB — RESP PANEL BY RT-PCR (FLU A&B, COVID) ARPGX2
Influenza A by PCR: NEGATIVE
Influenza B by PCR: NEGATIVE
SARS Coronavirus 2 by RT PCR: NEGATIVE

## 2020-03-02 LAB — ETHANOL: Alcohol, Ethyl (B): <10 mg/dL (ref <10)

## 2020-03-02 LAB — PROTIME-INR
INR: 0.9 (ref 0.8–1.2)
Prothrombin Time: 11.8 seconds (ref 11.4–15.2)

## 2020-03-02 MED ORDER — OXYCODONE-ACETAMINOPHEN 5-325 MG PO TABS: 1.0000 | ORAL_TABLET | ORAL | 0 refills | Status: DC | PRN

## 2020-03-02 MED ORDER — ETOMIDATE 2 MG/ML IV SOLN
10.0000 mg | Freq: Once | INTRAVENOUS | Status: AC
  Administered 2020-03-02: 10 mg via INTRAVENOUS
  Filled 2020-03-02: qty 10

## 2020-03-02 MED ORDER — MIDAZOLAM HCL 2 MG/2ML IJ SOLN
2.0000 mg | Freq: Once | INTRAMUSCULAR | Status: AC
  Administered 2020-03-02: 2 mg via INTRAVENOUS
  Filled 2020-03-02: qty 2

## 2020-03-02 MED ORDER — IOHEXOL 300 MG/ML  SOLN
100.0000 mL | Freq: Once | INTRAMUSCULAR | Status: AC | PRN
  Administered 2020-03-02: 100 mL via INTRAVENOUS

## 2020-03-02 MED ORDER — ONDANSETRON HCL 4 MG/2ML IJ SOLN
4.0000 mg | Freq: Once | INTRAMUSCULAR | Status: AC
  Administered 2020-03-02: 4 mg via INTRAVENOUS
  Filled 2020-03-02: qty 2

## 2020-03-02 MED ORDER — MORPHINE SULFATE (PF) 4 MG/ML IV SOLN
4.0000 mg | Freq: Once | INTRAVENOUS | Status: AC
  Administered 2020-03-02: 4 mg via INTRAVENOUS
  Filled 2020-03-02: qty 1
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
## 2020-03-02 NOTE — ED Notes (Signed)
Patient sat up in bed with assistance. Grimaces with pain as he moved, instructions given on pain control and expectations d/t injury. Returned to patients room w/ wheelchair and patient remained seating in bed, appears mildly diaphoretic, wife at bedside. Stated he does not feel well, dizzy and needed to lay down. Assisted patient back in bed, monitor applied, VSS. Will notify EDP.
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
## 2020-03-02 NOTE — ED Notes (Signed)
Patient transported to CT; accompanied by ED tech and primary RN

## 2020-03-02 NOTE — ED Notes (Signed)
Incentive spirometry at bedside, awaiting RT for proper teaching.

## 2020-03-03 ENCOUNTER — Telehealth: Payer: Self-pay | Admitting: Family Medicine
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
## 2020-03-03 NOTE — Telephone Encounter (Signed)
Please review. Thanks!  

## 2020-03-08 DIAGNOSIS — M25512 Pain in left shoulder: Secondary | ICD-10-CM | POA: Insufficient documentation

## 2020-03-10 ENCOUNTER — Encounter: Payer: Self-pay | Admitting: Family Medicine

## 2020-03-10 ENCOUNTER — Ambulatory Visit (INDEPENDENT_AMBULATORY_CARE_PROVIDER_SITE_OTHER): Payer: Medicare Other | Admitting: Family Medicine

## 2020-03-10 ENCOUNTER — Other Ambulatory Visit: Payer: Self-pay

## 2020-03-10 VITALS — BP 135/81 | HR 80 | Temp 97.9°F | Wt 196.0 lb

## 2020-03-10 DIAGNOSIS — S2241XA Multiple fractures of ribs, right side, initial encounter for closed fracture: Secondary | ICD-10-CM | POA: Diagnosis not present

## 2020-03-10 DIAGNOSIS — S43005A Unspecified dislocation of left shoulder joint, initial encounter: Secondary | ICD-10-CM | POA: Diagnosis not present

## 2020-03-10 DIAGNOSIS — T1490XA Injury, unspecified, initial encounter: Secondary | ICD-10-CM | POA: Diagnosis not present

## 2020-03-10 DIAGNOSIS — S43015D Anterior dislocation of left humerus, subsequent encounter: Secondary | ICD-10-CM | POA: Diagnosis not present

## 2020-03-10 NOTE — Progress Notes (Signed)
Established patient visit   Patient: Patrick Long   DOB: Sep 09, 1950   70 y.o. Male  MRN: [id number] Visit Date: 03/10/2020  Today's healthcare provider: Wilhemena Durie, MD   Chief Complaint  Patient presents with  . Follow-up   Subjective    HPI  Follow up ER visit Patient is slowly recovering but is improving and has had no setbacks.  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 Patient was seen in ER for being struck by a bull  on 03/02/2020. He was treated for Dislocated left shoulder, closed fracture of multiple ribs. Treatment for this included Orthopedic referral. He reports excellent compliance with treatment. He reports this condition is Improved.  -----------------------------------------------------------------------------------------    Patient Active Problem List   Diagnosis Date Noted  . Pain and swelling of lower leg, right 03/27/2019  . Special screening for malignant neoplasms, colon   . Benign neoplasm of sigmoid colon   . Prostate cancer screening 09/22/2015  . Colon cancer screening 09/22/2015  . Actinic keratoses 06/29/2014  . Allergic rhinitis 06/29/2014  . Narrowing of intervertebral disc space 06/29/2014  . History of DVT (deep vein thrombosis) 06/29/2014  . Essential (primary) hypertension 06/29/2014  . Acid reflux 06/29/2014  . HLD (hyperlipidemia) 06/29/2014  . Squamous cell carcinoma 06/29/2014  . Gastro-esophageal reflux disease without esophagitis 06/29/2014  . Connective tissue and disc stenosis of intervertebral foramina of abdomen and other regions 06/29/2014   Past Medical History:  Diagnosis Date  . GERD (gastroesophageal reflux disease)   . Hypertension   . Peripheral vascular disease ([place]) 2015   superficial blood clots - right lower leg   Social History   Tobacco Use  . Smoking status: Former Smoker    Years: 10.00    Types: Cigarettes    Quit date: 02/14/1980    Years since quitting: 40.0  . Smokeless tobacco: Never  Used  Substance Use Topics  . Alcohol use: Yes    Alcohol/week: 6.0 standard drinks    Types: 6 Cans of beer per week    Comment:     . Drug use: No   Allergies  Allergen Reactions  . Fish Allergy Nausea And Vomiting    Can not eat any fish with bones  . Fish Allergy Swelling     Medications: Outpatient Medications Prior to Visit  Medication Sig  . amLODipine (NORVASC) 5 MG tablet TAKE 1 TABLET(5 MG) BY MOUTH DAILY  . amLODipine (NORVASC) 5 MG tablet Take 5 mg by mouth daily.  Marland Kitchen aspirin 81 MG tablet Take 81 mg by mouth daily.  Marland Kitchen esomeprazole (NEXIUM) 40 MG capsule Take 40 mg by mouth every other day.  . loratadine (CLARITIN) 10 MG tablet Take 10 mg by mouth daily.  Marland Kitchen losartan (COZAAR) 100 MG tablet TAKE 1 TABLET BY MOUTH ONCE DAILY  . metroNIDAZOLE (METROGEL) 1 % gel APPLY EXTERNALLY TO THE AFFECTED AREA DAILY  . Multiple Vitamin (MULTIVITAMIN WITH MINERALS) TABS tablet Take 1 tablet by mouth daily.  Marland Kitchen oxyCODONE-acetaminophen (PERCOCET/ROXICET) 5-325 MG tablet Take 1 tablet by mouth every 4 (four) hours as needed for severe pain.  Marland Kitchen aspirin EC 81 MG tablet Take 81 mg by mouth daily. Swallow whole.  . clobetasol ointment (TEMOVATE) 0.05 % APP EXT AA BID UNTIL GONE (Patient not taking: Reported on 03/10/2020)  . esomeprazole (NEXIUM) 40 MG capsule TAKE 1 CAPSULE BY MOUTH EVERY OTHER DAY AS DIRECTED  . losartan (COZAAR) 100 MG tablet Take 100 mg by  mouth daily.  . Multiple Vitamins-Minerals (MULTIVITAMIN ADULT PO) Take 1 tablet by mouth daily.    No facility-administered medications prior to visit.    Review of Systems  Constitutional: Negative.   Respiratory: Negative.   Cardiovascular: Negative.   Gastrointestinal: Negative.   Musculoskeletal: Positive for arthralgias, joint swelling and myalgias. Negative for back pain, gait problem, neck pain and neck stiffness.  Neurological: Negative for dizziness, light-headedness and headaches.       Objective    BP 135/81 (BP  Location: Right Arm, Patient Position: Sitting, Cuff Size: Large)   Pulse 80   Temp 97.9 F (36.6 C) (Oral)   Wt 196 lb (88.9 kg)   BMI 26.58 kg/m     Physical Exam Vitals reviewed.  Constitutional:      Appearance: He is well-developed.  HENT:     Head: Normocephalic and atraumatic.     Right Ear: External ear normal.     Left Ear: External ear normal.     Nose: Nose normal.  Eyes:     Conjunctiva/sclera: Conjunctivae normal.     Pupils: Pupils are equal, round, and reactive to light.  Cardiovascular:     Rate and Rhythm: Normal rate and regular rhythm.     Heart sounds: Normal heart sounds.  Pulmonary:     Effort: Pulmonary effort is normal.     Breath sounds: Normal breath sounds.  Abdominal:     General: Bowel sounds are normal.     Palpations: Abdomen is soft.  Musculoskeletal:     Cervical back: Normal range of motion and neck supple.     Comments: Left arm in sling.  Skin:    General: Skin is warm and dry.  Neurological:     General: No focal deficit present.     Mental Status: He is alert and oriented to person, place, and time.  Psychiatric:        Mood and Affect: Mood normal.        Behavior: Behavior normal.        Thought Content: Thought content normal.        Judgment: Judgment normal.       No results found for any visits on 03/10/20.  Assessment & Plan     1. Trauma Patient attacked by bull.  He is slowly improving with no setbacks.  2. Shoulder separation, left, initial encounter Has follow-up with orthopedics.  3. Closed fracture of multiple ribs of right side, initial encounter Doing well.  Still sore but improving. No  Follow-up necessary.   No follow-ups on file.      I, Wilhemena Durie, MD, have reviewed all documentation for this visit. The documentation on 03/14/20 for the exam, diagnosis, procedures, and orders are all accurate and complete.     Cranford Mon, MD  [facility] [phone]  (phone) [phone] (fax)  [place]

## 2020-03-22 DIAGNOSIS — S43015D Anterior dislocation of left humerus, subsequent encounter: Secondary | ICD-10-CM | POA: Diagnosis not present

## 2020-03-25 DIAGNOSIS — S43015D Anterior dislocation of left humerus, subsequent encounter: Secondary | ICD-10-CM | POA: Diagnosis not present

## 2020-03-29 DIAGNOSIS — M25512 Pain in left shoulder: Secondary | ICD-10-CM | POA: Diagnosis not present

## 2020-04-02 DIAGNOSIS — S43015D Anterior dislocation of left humerus, subsequent encounter: Secondary | ICD-10-CM | POA: Diagnosis not present

## 2020-04-07 DIAGNOSIS — S43015D Anterior dislocation of left humerus, subsequent encounter: Secondary | ICD-10-CM | POA: Diagnosis not present

## 2020-04-08 DIAGNOSIS — M25512 Pain in left shoulder: Secondary | ICD-10-CM | POA: Diagnosis not present

## 2020-04-09 DIAGNOSIS — S43015D Anterior dislocation of left humerus, subsequent encounter: Secondary | ICD-10-CM | POA: Diagnosis not present

## 2020-04-13 DIAGNOSIS — S43015D Anterior dislocation of left humerus, subsequent encounter: Secondary | ICD-10-CM | POA: Diagnosis not present

## 2020-04-15 DIAGNOSIS — S43015D Anterior dislocation of left humerus, subsequent encounter: Secondary | ICD-10-CM | POA: Diagnosis not present

## 2020-04-19 DIAGNOSIS — S43015D Anterior dislocation of left humerus, subsequent encounter: Secondary | ICD-10-CM | POA: Diagnosis not present

## 2020-04-21 DIAGNOSIS — S43015D Anterior dislocation of left humerus, subsequent encounter: Secondary | ICD-10-CM | POA: Diagnosis not present

## 2020-04-26 DIAGNOSIS — L570 Actinic keratosis: Secondary | ICD-10-CM | POA: Diagnosis not present

## 2020-04-26 DIAGNOSIS — D485 Neoplasm of uncertain behavior of skin: Secondary | ICD-10-CM | POA: Diagnosis not present

## 2020-04-26 DIAGNOSIS — D225 Melanocytic nevi of trunk: Secondary | ICD-10-CM | POA: Diagnosis not present

## 2020-04-26 DIAGNOSIS — D2271 Melanocytic nevi of right lower limb, including hip: Secondary | ICD-10-CM | POA: Diagnosis not present

## 2020-04-26 DIAGNOSIS — D2261 Melanocytic nevi of right upper limb, including shoulder: Secondary | ICD-10-CM | POA: Diagnosis not present

## 2020-04-26 DIAGNOSIS — D2262 Melanocytic nevi of left upper limb, including shoulder: Secondary | ICD-10-CM | POA: Diagnosis not present

## 2020-04-26 DIAGNOSIS — D044 Carcinoma in situ of skin of scalp and neck: Secondary | ICD-10-CM | POA: Diagnosis not present

## 2020-04-26 DIAGNOSIS — Z85828 Personal history of other malignant neoplasm of skin: Secondary | ICD-10-CM | POA: Diagnosis not present

## 2020-04-27 NOTE — Progress Notes (Signed)
Established patient visit   Patient: Patrick Long   DOB: 11-12-1950   70 y.o. Male  MRN: [id number] Visit Date: 04/28/2020  Today's healthcare provider: Wilhemena Durie, MD   Chief Complaint  Patient presents with   Follow-up   Hypertension   Subjective    HPI  Patient comes in today for follow-up.  He is almost back to regular work.  He feels well and is taking his medication as prescribed.  He is taking the Nexium Monday Wednesday and Friday is controlling his symptoms.  He was unable to stop Hypertension, follow-up  BP Readings from Last 3 Encounters:  04/28/20 136/87  03/10/20 135/81  03/02/20 121/84   Wt Readings from Last 3 Encounters:  04/28/20 198 lb (89.8 kg)  03/10/20 196 lb (88.9 kg)  03/02/20 192 lb (87.1 kg)     He was last seen for hypertension 3 months ago.  BP at that visit was 131/89. Management since that visit includes; on amlodipine and losartan. He reports good compliance with treatment. He is not having side effects.  He is not exercising. He is adherent to low salt diet.   Outside blood pressures are checked daily. Patient reports that it averages in the 120s/80s at home.   He does not smoke.  Use of agents associated with hypertension: none.       Medications: Outpatient Medications Prior to Visit  Medication Sig   amLODipine (NORVASC) 5 MG tablet TAKE 1 TABLET(5 MG) BY MOUTH DAILY   amLODipine (NORVASC) 5 MG tablet Take 5 mg by mouth daily.   aspirin 81 MG tablet Take 81 mg by mouth daily.   aspirin EC 81 MG tablet Take 81 mg by mouth daily. Swallow whole.   esomeprazole (NEXIUM) 40 MG capsule TAKE 1 CAPSULE BY MOUTH EVERY OTHER DAY AS DIRECTED   esomeprazole (NEXIUM) 40 MG capsule Take 40 mg by mouth every other day.   loratadine (CLARITIN) 10 MG tablet Take 10 mg by mouth daily.   losartan (COZAAR) 100 MG tablet TAKE 1 TABLET BY MOUTH ONCE DAILY   losartan (COZAAR) 100 MG tablet Take 100 mg by mouth daily.    metroNIDAZOLE (METROGEL) 1 % gel APPLY EXTERNALLY TO THE AFFECTED AREA DAILY   Multiple Vitamin (MULTIVITAMIN WITH MINERALS) TABS tablet Take 1 tablet by mouth daily.   Multiple Vitamins-Minerals (MULTIVITAMIN ADULT PO) Take 1 tablet by mouth daily.    oxyCODONE-acetaminophen (PERCOCET/ROXICET) 5-325 MG tablet Take 1 tablet by mouth every 4 (four) hours as needed for severe pain.   clobetasol ointment (TEMOVATE) 0.05 % APP EXT AA BID UNTIL GONE (Patient not taking: No sig reported)   No facility-administered medications prior to visit.    Review of Systems  Constitutional: Negative for appetite change, chills and fever.  Respiratory: Negative for chest tightness, shortness of breath and wheezing.   Cardiovascular: Negative for chest pain and palpitations.  Gastrointestinal: Negative for abdominal pain, nausea and vomiting.       Objective    BP 136/87    Pulse 71    Temp 97.9 F (36.6 C)    Resp 16    Ht 6' (1.829 m)    Wt 198 lb (89.8 kg)    BMI 26.85 kg/m     Physical Exam Vitals reviewed.  Constitutional:      Appearance: He is well-developed.  HENT:     Head: Normocephalic and atraumatic.     Right Ear: External ear normal.  Left Ear: External ear normal.     Nose: Nose normal.  Eyes:     Conjunctiva/sclera: Conjunctivae normal.     Pupils: Pupils are equal, round, and reactive to light.  Cardiovascular:     Rate and Rhythm: Normal rate and regular rhythm.     Heart sounds: Normal heart sounds.  Pulmonary:     Effort: Pulmonary effort is normal.     Breath sounds: Normal breath sounds.  Abdominal:     General: Bowel sounds are normal.     Palpations: Abdomen is soft.  Musculoskeletal:     Cervical back: Normal range of motion and neck supple.  Skin:    General: Skin is warm and dry.  Neurological:     General: No focal deficit present.     Mental Status: He is alert and oriented to person, place, and time.  Psychiatric:        Mood and Affect: Mood  normal.        Behavior: Behavior normal.        Thought Content: Thought content normal.        Judgment: Judgment normal.       No results found for any visits on 04/28/20.  Assessment & Plan     1. Essential (primary) hypertension Good control on amlodipine and losartan - Comprehensive metabolic panel  2. Other hyperlipidemia  - Lipid panel  3. Fatigue, unspecified type  - CBC with Differential/Platelet - TSH  4. Screening for blood or protein in urine  - POCT urinalysis dipstick 5.  GERD On Monday Wednesday Friday Nexium  No follow-ups on file.      I, Wilhemena Durie, MD, have reviewed all documentation for this visit. The documentation on 04/30/20 for the exam, diagnosis, procedures, and orders are all accurate and complete.    Corita Allinson Cranford Mon, MD  [facility] [phone] (phone) [phone] (fax)  [place]

## 2020-04-28 ENCOUNTER — Other Ambulatory Visit: Payer: Self-pay

## 2020-04-28 ENCOUNTER — Encounter: Payer: Self-pay | Admitting: Family Medicine

## 2020-04-28 ENCOUNTER — Ambulatory Visit (INDEPENDENT_AMBULATORY_CARE_PROVIDER_SITE_OTHER): Payer: Medicare Other | Admitting: Family Medicine

## 2020-04-28 VITALS — BP 136/87 | HR 71 | Temp 97.9°F | Resp 16 | Ht 72.0 in | Wt 198.0 lb

## 2020-04-28 DIAGNOSIS — R5383 Other fatigue: Secondary | ICD-10-CM

## 2020-04-28 DIAGNOSIS — E7849 Other hyperlipidemia: Secondary | ICD-10-CM

## 2020-04-28 DIAGNOSIS — Z1389 Encounter for screening for other disorder: Secondary | ICD-10-CM

## 2020-04-28 DIAGNOSIS — I10 Essential (primary) hypertension: Secondary | ICD-10-CM

## 2020-04-28 LAB — POCT URINALYSIS DIPSTICK
Bilirubin, UA: NEGATIVE
Blood, UA: NEGATIVE
Glucose, UA: NEGATIVE
Ketones, UA: NEGATIVE
Leukocytes, UA: NEGATIVE
Nitrite, UA: NEGATIVE
Protein, UA: NEGATIVE
Spec Grav, UA: 1.015 (ref 1.010–1.025)
Urobilinogen, UA: 0.2 E.U./dL
pH, UA: 6 (ref 5.0–8.0)

## 2020-04-29 LAB — LIPID PANEL
Chol/HDL Ratio: 6.2 ratio — ABNORMAL HIGH (ref 0.0–5.0)
Cholesterol, Total: 253 mg/dL — ABNORMAL HIGH (ref 100–199)
HDL: 41 mg/dL (ref >39)
LDL Chol Calc (NIH): 144 mg/dL — ABNORMAL HIGH (ref 0–99)
Triglycerides: 371 mg/dL — ABNORMAL HIGH (ref 0–149)
VLDL Cholesterol Cal: 68 mg/dL — ABNORMAL HIGH (ref 5–40)

## 2020-04-29 LAB — CBC WITH DIFFERENTIAL/PLATELET
Basophils Absolute: 0 10*3/uL (ref 0.0–0.2)
Basos: 1 %
EOS (ABSOLUTE): 0.2 10*3/uL (ref 0.0–0.4)
Eos: 3 %
Hematocrit: 43.9 % (ref 37.5–51.0)
Hemoglobin: 14.6 g/dL (ref 13.0–17.7)
Immature Grans (Abs): 0 10*3/uL (ref 0.0–0.1)
Immature Granulocytes: 0 %
Lymphocytes Absolute: 2 10*3/uL (ref 0.7–3.1)
Lymphs: 36 %
MCH: 29.9 pg (ref 26.6–33.0)
MCHC: 33.3 g/dL (ref 31.5–35.7)
MCV: 90 fL (ref 79–97)
Monocytes Absolute: 0.5 10*3/uL (ref 0.1–0.9)
Monocytes: 9 %
Neutrophils Absolute: 2.8 10*3/uL (ref 1.4–7.0)
Neutrophils: 51 %
Platelets: 279 10*3/uL (ref 150–450)
RBC: 4.88 x10E6/uL (ref 4.14–5.80)
RDW: 12 % (ref 11.6–15.4)
WBC: 5.4 10*3/uL (ref 3.4–10.8)

## 2020-04-29 LAB — COMPREHENSIVE METABOLIC PANEL
ALT: 21 IU/L (ref 0–44)
AST: 20 IU/L (ref 0–40)
Albumin/Globulin Ratio: 1.5 (ref 1.2–2.2)
Albumin: 4.3 g/dL (ref 3.8–4.8)
Alkaline Phosphatase: 159 IU/L — ABNORMAL HIGH (ref 44–121)
BUN/Creatinine Ratio: 19 (ref 10–24)
BUN: 16 mg/dL (ref 8–27)
Bilirubin Total: 0.4 mg/dL (ref 0.0–1.2)
CO2: 23 mmol/L (ref 20–29)
Calcium: 9.5 mg/dL (ref 8.6–10.2)
Chloride: 102 mmol/L (ref 96–106)
Creatinine, Ser: 0.86 mg/dL (ref 0.76–1.27)
Globulin, Total: 2.8 g/dL (ref 1.5–4.5)
Glucose: 96 mg/dL (ref 65–99)
Potassium: 4.6 mmol/L (ref 3.5–5.2)
Sodium: 139 mmol/L (ref 134–144)
Total Protein: 7.1 g/dL (ref 6.0–8.5)
eGFR: 93 mL/min/{1.73_m2} (ref >59)

## 2020-04-29 LAB — TSH: TSH: 0.839 u[IU]/mL (ref 0.450–4.500)

## 2020-05-03 DIAGNOSIS — S43015D Anterior dislocation of left humerus, subsequent encounter: Secondary | ICD-10-CM | POA: Diagnosis not present

## 2020-05-06 DIAGNOSIS — M25512 Pain in left shoulder: Secondary | ICD-10-CM | POA: Diagnosis not present

## 2020-05-07 DIAGNOSIS — S43015D Anterior dislocation of left humerus, subsequent encounter: Secondary | ICD-10-CM | POA: Diagnosis not present

## 2020-05-19 DIAGNOSIS — D044 Carcinoma in situ of skin of scalp and neck: Secondary | ICD-10-CM | POA: Diagnosis not present

## 2020-05-21 ENCOUNTER — Other Ambulatory Visit: Payer: Self-pay | Admitting: Family Medicine

## 2020-05-21 DIAGNOSIS — I10 Essential (primary) hypertension: Secondary | ICD-10-CM

## 2020-05-21 NOTE — Telephone Encounter (Signed)
Requested Prescriptions  Pending Prescriptions Disp Refills  . amLODipine (NORVASC) 5 MG tablet [Pharmacy Med Name: AMLODIPINE BESYLATE 5MG  TABLETS] 90 tablet 1    Sig: TAKE 1 TABLET(5 MG) BY MOUTH DAILY     Cardiovascular:  Calcium Channel Blockers Passed - 05/21/2020  6:41 AM      Passed - Last BP in normal range    BP Readings from Last 1 Encounters:  04/28/20 136/87         Passed - Valid encounter within last 6 months    Recent Outpatient Visits          3 weeks ago Essential (primary) hypertension   [facility] Jerrol Banana., MD   2 months ago [place] Jerrol Banana., MD   3 months ago Annual physical exam   [facility] Jerrol Banana., MD   1 year ago Annual physical exam   [facility] Jerrol Banana., MD   1 year ago Essential (primary) hypertension   [facility] Jerrol Banana., MD      Future Appointments            In 8 months Jerrol Banana., MD [facility] [place]

## 2020-06-04 ENCOUNTER — Ambulatory Visit: Payer: Self-pay

## 2020-06-04 NOTE — Telephone Encounter (Signed)
Pt. Tested positive today for COVID 19 with a home test. Symptoms started 06/02/20. Has "cold symptoms - cough, head congestion, fatigue." Taking OTC decongestant. No shortness of breath, no fever.Reviewed home remedies. Wants further advise from his PCP.  Answer Assessment - Initial Assessment Questions 1. COVID-19 DIAGNOSIS: "Who made your COVID-19 diagnosis?" "Was it confirmed by a positive lab test or self-test?" If not diagnosed by a doctor (or NP/PA), ask "Are there lots of cases (community spread) where you live?" Note: See public health department website, if unsure.     Home test 2. COVID-19 EXPOSURE: "Was there any known exposure to COVID before the symptoms began?" CDC Definition of close contact: within 6 feet (2 meters) for a total of 15 minutes or more over a 24-hour period.      No 3. ONSET: "When did the COVID-19 symptoms start?"      06/02/20 4. WORST SYMPTOM: "What is your worst symptom?" (e.g., cough, fever, shortness of breath, muscle aches)     Head congestion 5. COUGH: "Do you have a cough?" If Yes, ask: "How bad is the cough?"       Mild 6. FEVER: "Do you have a fever?" If Yes, ask: "What is your temperature, how was it measured, and when did it start?"     No 7. RESPIRATORY STATUS: "Describe your breathing?" (e.g., shortness of breath, wheezing, unable to speak)      No 8. BETTER-SAME-WORSE: "Are you getting better, staying the same or getting worse compared to yesterday?"  If getting worse, ask, "In what way?"     Better 9. HIGH RISK DISEASE: "Do you have any chronic medical problems?" (e.g., asthma, heart or lung disease, weak immune system, obesity, etc.)     HTN 10. VACCINE: "Have you had the COVID-19 vaccine?" If Yes, ask: "Which one, how many shots, when did you get it?"       Yes 11. BOOSTER: "Have you received your COVID-19 booster?" If Yes, ask: "Which one and when did you get it?"       Yes 12. PREGNANCY: "Is there any chance you are pregnant?" "When was your  last menstrual period?"       n/a 13. OTHER SYMPTOMS: "Do you have any other symptoms?"  (e.g., chills, fatigue, headache, loss of smell or taste, muscle pain, sore throat)       Fatigue 14. O2 SATURATION MONITOR:  "Do you use an oxygen saturation monitor (pulse oximeter) at home?" If Yes, ask "What is your reading (oxygen level) today?" "What is your usual oxygen saturation reading?" (e.g., 95%)       No  Protocols used: CORONAVIRUS (COVID-19) DIAGNOSED OR SUSPECTED-A-AH

## 2020-06-04 NOTE — Telephone Encounter (Signed)
Spoke with patient who reports slight cough and congestion. I advised patient to quarantine for 10-14 days , anyone in home should be tested. Patient encouraged to drink plenty of fluid and to rest, advised to take otc Delysm PRN for cough and can take decongestant such as Mucinex or Sudafed for sinus relief. Patient verbalized understanding. KW

## 2020-07-02 DIAGNOSIS — H527 Unspecified disorder of refraction: Secondary | ICD-10-CM | POA: Diagnosis not present

## 2020-07-02 DIAGNOSIS — H524 Presbyopia: Secondary | ICD-10-CM | POA: Diagnosis not present

## 2020-08-19 ENCOUNTER — Other Ambulatory Visit: Payer: Self-pay | Admitting: Family Medicine

## 2020-08-19 ENCOUNTER — Telehealth: Payer: Self-pay

## 2020-08-19 NOTE — Telephone Encounter (Signed)
Copied from [place] [id number]. Topic: General - Other >> Aug 19, 2020  9:23 AM Leward Quan A wrote: Reason for CRM: Patient called in to see Dr Rosanna Randy today or tomorrow having issues with his lower back and dont want to wait till 08/22/20 please call today with an answer at Ph#  [phone]

## 2020-08-19 NOTE — Telephone Encounter (Signed)
Did you want to work the patient in? Please advise. Thanks!

## 2020-08-19 NOTE — Telephone Encounter (Signed)
Scheduled apt with Dr. Caryn Section 08/20/2020 at 10am.   Thanks,   -Mickel Baas

## 2020-08-20 ENCOUNTER — Encounter: Payer: Self-pay | Admitting: Family Medicine

## 2020-08-20 ENCOUNTER — Other Ambulatory Visit: Payer: Self-pay

## 2020-08-20 ENCOUNTER — Ambulatory Visit (INDEPENDENT_AMBULATORY_CARE_PROVIDER_SITE_OTHER): Payer: Medicare Other | Admitting: Family Medicine

## 2020-08-20 VITALS — BP 122/93 | HR 79 | Wt 199.0 lb

## 2020-08-20 DIAGNOSIS — M5441 Lumbago with sciatica, right side: Secondary | ICD-10-CM

## 2020-08-20 MED ORDER — NAPROXEN 500 MG PO TABS: 500.0000 mg | ORAL_TABLET | Freq: Two times a day (BID) | ORAL | 0 refills | Status: DC

## 2020-08-20 NOTE — Patient Instructions (Signed)
.   Please review the attached list of medications and notify my office if there are any errors.   . Please bring all of your medications to every appointment so we can make sure that our medication list is the same as yours.   

## 2020-08-20 NOTE — Progress Notes (Signed)
Established patient visit   Patient: Patrick Long   DOB: [birth year]   70 y.o. Male  MRN: [id number] Visit Date: 08/20/2020  Today's healthcare provider: Lelon Huh, MD   Chief Complaint  Patient presents with   Back Pain    Subjective    Back Pain This is a chronic problem. The problem has been gradually worsening (Especially since last week.) since onset. The pain is present in the lumbar spine. The quality of the pain is described as aching. Radiates to: Right hip. The symptoms are aggravated by position and standing. Stiffness is present In the morning. Pertinent negatives include no bladder incontinence, bowel incontinence, dysuria, headaches, leg pain, numbness, paresthesias, pelvic pain, perianal numbness, tingling or weakness. He has tried NSAIDs and muscle relaxant (Rest) for the symptoms. The treatment provided mild relief.        Medications: Outpatient Medications Prior to Visit  Medication Sig   amLODipine (NORVASC) 5 MG tablet TAKE 1 TABLET(5 MG) BY MOUTH DAILY   aspirin 81 MG tablet Take 81 mg by mouth daily.   esomeprazole (NEXIUM) 40 MG capsule TAKE 1 CAPSULE BY MOUTH EVERY OTHER DAY AS DIRECTED   loratadine (CLARITIN) 10 MG tablet Take 10 mg by mouth daily.   losartan (COZAAR) 100 MG tablet TAKE 1 TABLET BY MOUTH ONCE DAILY   metroNIDAZOLE (METROGEL) 1 % gel APPLY EXTERNALLY TO THE AFFECTED AREA DAILY   Multiple Vitamin (MULTIVITAMIN WITH MINERALS) TABS tablet Take 1 tablet by mouth daily.   amLODipine (NORVASC) 5 MG tablet Take 5 mg by mouth daily.   aspirin EC 81 MG tablet Take 81 mg by mouth daily. Swallow whole.   clobetasol ointment (TEMOVATE) 0.05 % APP EXT AA BID UNTIL GONE (Patient not taking: No sig reported)   esomeprazole (NEXIUM) 40 MG capsule Take 40 mg by mouth every other day.   losartan (COZAAR) 100 MG tablet Take 100 mg by mouth daily.   Multiple Vitamins-Minerals (MULTIVITAMIN ADULT PO) Take 1 tablet by mouth daily.     oxyCODONE-acetaminophen (PERCOCET/ROXICET) 5-325 MG tablet Take 1 tablet by mouth every 4 (four) hours as needed for severe pain.   No facility-administered medications prior to visit.    Review of Systems  Constitutional: Negative.   Respiratory: Negative.    Cardiovascular: Negative.   Gastrointestinal: Negative.  Negative for bowel incontinence.  Genitourinary:  Negative for bladder incontinence, dysuria and pelvic pain.  Musculoskeletal:  Positive for arthralgias and back pain. Negative for gait problem, joint swelling, myalgias, neck pain and neck stiffness.  Neurological:  Negative for dizziness, tingling, weakness, light-headedness, numbness, headaches and paresthesias.      Objective    BP (!) 122/93 (BP Location: Right Arm, Patient Position: Sitting, Cuff Size: Large)   Pulse 79   Wt 199 lb (90.3 kg)   SpO2 99%   BMI 26.99 kg/m     Physical Exam  General appearance:  Well developed, well nourished male, cooperative and in no acute distress Head: Normocephalic, without obvious abnormality, atraumatic Respiratory: Respirations even and unlabored, normal respiratory rate MS: Mildly tender along right para lumbar muscles. Negative straight leg test.   No results found for any visits on 08/20/20.  Assessment & Plan     1. Acute bilateral low back pain with right-sided sciatica Is improving. Will start scheduled NSAIDs for 1-2 weeks. Given printout of back exercises.  - naproxen (NAPROSYN) 500 MG tablet; Take 1 tablet (500 mg total) by mouth 2 (two) times daily with  a meal.  Dispense: 30 tablet; Refill: 0      The entirety of the information documented in the History of Present Illness, Review of Systems and Physical Exam were personally obtained by me. Portions of this information were initially documented by the CMA and reviewed by me for thoroughness and accuracy.     Lelon Huh, MD  [facility] [phone] (phone) [phone] (fax)  [place]

## 2020-10-27 DIAGNOSIS — X32XXXA Exposure to sunlight, initial encounter: Secondary | ICD-10-CM | POA: Diagnosis not present

## 2020-10-27 DIAGNOSIS — D225 Melanocytic nevi of trunk: Secondary | ICD-10-CM | POA: Diagnosis not present

## 2020-10-27 DIAGNOSIS — D2272 Melanocytic nevi of left lower limb, including hip: Secondary | ICD-10-CM | POA: Diagnosis not present

## 2020-10-27 DIAGNOSIS — L570 Actinic keratosis: Secondary | ICD-10-CM | POA: Diagnosis not present

## 2020-10-27 DIAGNOSIS — D2261 Melanocytic nevi of right upper limb, including shoulder: Secondary | ICD-10-CM | POA: Diagnosis not present

## 2020-10-27 DIAGNOSIS — Z85828 Personal history of other malignant neoplasm of skin: Secondary | ICD-10-CM | POA: Diagnosis not present

## 2020-11-17 ENCOUNTER — Other Ambulatory Visit: Payer: Self-pay | Admitting: Family Medicine

## 2020-11-17 DIAGNOSIS — I10 Essential (primary) hypertension: Secondary | ICD-10-CM

## 2021-01-26 DIAGNOSIS — M25562 Pain in left knee: Secondary | ICD-10-CM | POA: Insufficient documentation

## 2021-01-27 DIAGNOSIS — M25562 Pain in left knee: Secondary | ICD-10-CM | POA: Diagnosis not present

## 2021-01-27 DIAGNOSIS — M1712 Unilateral primary osteoarthritis, left knee: Secondary | ICD-10-CM | POA: Diagnosis not present

## 2021-02-01 ENCOUNTER — Encounter: Payer: Self-pay | Admitting: Family Medicine

## 2021-02-02 ENCOUNTER — Encounter: Payer: Medicare Other | Admitting: Family Medicine

## 2021-02-16 DIAGNOSIS — H2513 Age-related nuclear cataract, bilateral: Secondary | ICD-10-CM | POA: Diagnosis not present

## 2021-03-02 ENCOUNTER — Encounter: Payer: Self-pay | Admitting: Family Medicine

## 2021-03-02 ENCOUNTER — Other Ambulatory Visit: Payer: Self-pay

## 2021-03-02 ENCOUNTER — Ambulatory Visit (INDEPENDENT_AMBULATORY_CARE_PROVIDER_SITE_OTHER): Payer: Medicare Other | Admitting: Family Medicine

## 2021-03-02 VITALS — BP 126/75 | HR 76 | Temp 98.4°F | Resp 16 | Ht 72.0 in | Wt 197.0 lb

## 2021-03-02 DIAGNOSIS — Z1329 Encounter for screening for other suspected endocrine disorder: Secondary | ICD-10-CM

## 2021-03-02 DIAGNOSIS — E7849 Other hyperlipidemia: Secondary | ICD-10-CM | POA: Diagnosis not present

## 2021-03-02 DIAGNOSIS — K219 Gastro-esophageal reflux disease without esophagitis: Secondary | ICD-10-CM

## 2021-03-02 DIAGNOSIS — Z0000 Encounter for general adult medical examination without abnormal findings: Secondary | ICD-10-CM

## 2021-03-02 DIAGNOSIS — Z86718 Personal history of other venous thrombosis and embolism: Secondary | ICD-10-CM | POA: Diagnosis not present

## 2021-03-02 DIAGNOSIS — I10 Essential (primary) hypertension: Secondary | ICD-10-CM | POA: Diagnosis not present

## 2021-03-02 NOTE — Progress Notes (Signed)
BP 126/75 (BP Location: Left Arm, Patient Position: Sitting, Cuff Size: Large)    Pulse 76    Temp 98.4 F (36.9 C) (Temporal)    Resp 16    Ht 6' (1.829 m)    Wt 197 lb (89.4 kg)    SpO2 99%    BMI 26.72 kg/m    Subjective:    Patient ID: Patrick Long, male    DOB: [birth year], 71 y.o.   MRN: [id number]  HPI: Patrick Long is a 71 y.o. male presenting on 03/02/2021 for comprehensive medical examination. Current medical complaints include:none  Hypertension: - Medications: amlodipine, losartan - Compliance: good - Checking BP at home: yes, 120s SBP - Denies any LE edema, medication SEs, or symptoms of hypotension  GERD - Meds: nexium eod - Symptoms:  heartburn, indigestion - denies dysphagia has not lost weight denies melena, hematochezia, hematemesis, and coffee ground emesis.  - Previous treatment: antacids and proton pump inhibitors.   He currently lives with: wife Interim Problems from his last visit: no  Depression Screen done today and results listed below:  Depression screen [facility] 2/9 03/02/2021 01/29/2020 01/29/2019 06/27/2018 12/12/2017  Decreased Interest 0 0 0 0 0  Down, Depressed, Hopeless 0 0 0 0 0  PHQ - 2 Score 0 0 0 0 0  Altered sleeping 0 0 0 - -  Tired, decreased energy 0 0 0 - -  Change in appetite 0 0 0 - -  Feeling bad or failure about yourself  0 0 0 - -  Trouble concentrating 0 0 0 - -  Moving slowly or fidgety/restless 0 0 0 - -  Suicidal thoughts 0 0 0 - -  PHQ-9 Score 0 0 0 - -  Difficult doing work/chores Not difficult at all Not difficult at all Not difficult at all - -    The patient does not have a history of falls. I did not complete a risk assessment for falls. A plan of care for falls was not documented.   Past Medical History:  Past Medical History:  Diagnosis Date   GERD (gastroesophageal reflux disease)    Hypertension    Peripheral vascular disease ([place]) 2015   superficial blood clots - right lower leg    Surgical History:   Past Surgical History:  Procedure Laterality Date   COLONOSCOPY WITH PROPOFOL N/A 11/11/2015   Procedure: COLONOSCOPY WITH PROPOFOL;  Surgeon: Lucilla Lame, MD;  Location: [facility];  Service: Endoscopy;  Laterality: N/A;   ORIF TIBIA & FIBULA FRACTURES     POLYPECTOMY  11/11/2015   Procedure: POLYPECTOMY;  Surgeon: Lucilla Lame, MD;  Location: [facility];  Service: Endoscopy;;   WISDOM TOOTH EXTRACTION      Medications:  Current Outpatient Medications on File Prior to Visit  Medication Sig   amLODipine (NORVASC) 5 MG tablet TAKE 1 TABLET(5 MG) BY MOUTH DAILY   aspirin 81 MG tablet Take 81 mg by mouth daily.   esomeprazole (NEXIUM) 40 MG capsule TAKE 1 CAPSULE BY MOUTH EVERY OTHER DAY AS DIRECTED   loratadine (CLARITIN) 10 MG tablet Take 10 mg by mouth daily.   losartan (COZAAR) 100 MG tablet TAKE 1 TABLET BY MOUTH EVERY DAY   metroNIDAZOLE (METROGEL) 1 % gel APPLY EXTERNALLY TO THE AFFECTED AREA DAILY   Multiple Vitamin (MULTIVITAMIN WITH MINERALS) TABS tablet Take 1 tablet by mouth daily.   No current facility-administered medications on file prior to visit.    Allergies:  Allergies  Allergen Reactions  Fish Allergy Nausea And Vomiting    Can not eat any fish with bones   Fish Allergy Swelling    Social History:  Social History   Socioeconomic History   Marital status: Married    Spouse name: Dorinda   Number of children: 1   Years of education: 14   Highest education level: Not on file  Occupational History   Occupation: Physiological scientist: [employer]  Tobacco Use   Smoking status: Former    Years: 10.00    Types: Cigarettes    Quit date: 02/14/1980    Years since quitting: 41.0   Smokeless tobacco: Never  Substance and Sexual Activity   Alcohol use: Yes    Alcohol/week: 6.0 standard drinks    Types: 6 Cans of beer per week    Comment:      Drug use: No   Sexual activity: Yes  Other Topics Concern   Not on file  Social  History Narrative   Not on file   Social Determinants of Health   Financial Resource Strain: Not on file  Food Insecurity: Not on file  Transportation Needs: Not on file  Physical Activity: Not on file  Stress: Not on file  Social Connections: Not on file  Intimate Partner Violence: Not on file   Social History   Tobacco Use  Smoking Status Former   Years: 10.00   Types: Cigarettes   Quit date: 02/14/1980   Years since quitting: 41.0  Smokeless Tobacco Never   Social History   Substance and Sexual Activity  Alcohol Use Yes   Alcohol/week: 6.0 standard drinks   Types: 6 Cans of beer per week   Comment:       Family History:  Family History  Problem Relation Age of Onset   Asthma Mother    Arrhythmia Mother    Dementia Mother    Prostate cancer Father    Stroke Father    Glaucoma Sister     Past medical history, surgical history, medications, allergies, family history and social history reviewed with patient today and changes made to appropriate areas of the chart.      Objective:    BP 126/75 (BP Location: Left Arm, Patient Position: Sitting, Cuff Size: Large)    Pulse 76    Temp 98.4 F (36.9 C) (Temporal)    Resp 16    Ht 6' (1.829 m)    Wt 197 lb (89.4 kg)    SpO2 99%    BMI 26.72 kg/m   Wt Readings from Last 3 Encounters:  03/02/21 197 lb (89.4 kg)  08/20/20 199 lb (90.3 kg)  04/28/20 198 lb (89.8 kg)    Physical Exam Vitals reviewed.  Constitutional:      General: He is not in acute distress.    Appearance: Normal appearance. He is normal weight. He is not ill-appearing.  HENT:     Head: Normocephalic.     Right Ear: External ear normal.     Left Ear: External ear normal.     Nose: Nose normal.     Mouth/Throat:     Mouth: Mucous membranes are moist.     Pharynx: Oropharynx is clear.  Eyes:     Extraocular Movements: Extraocular movements intact.     Pupils: Pupils are equal, round, and reactive to light.  Cardiovascular:     Rate and Rhythm:  Normal rate and regular rhythm.     Heart sounds: Normal heart  sounds. No murmur heard. Pulmonary:     Effort: Pulmonary effort is normal. No respiratory distress.     Breath sounds: Normal breath sounds.  Abdominal:     General: Bowel sounds are normal.     Palpations: Abdomen is soft.     Tenderness: There is no abdominal tenderness.  Musculoskeletal:        General: Normal range of motion.     Cervical back: Normal range of motion.     Right lower leg: No edema.     Left lower leg: No edema.  Lymphadenopathy:     Cervical: No cervical adenopathy.  Skin:    General: Skin is warm and dry.  Neurological:     Mental Status: He is oriented to person, place, and time. Mental status is at baseline.     Gait: Gait normal.  Psychiatric:        Mood and Affect: Mood normal.        Behavior: Behavior normal.    Results for orders placed or performed in visit on 04/28/20  CBC with Differential/Platelet  Result Value Ref Range   WBC 5.4 3.4 - 10.8 x10E3/uL   RBC 4.88 4.14 - 5.80 x10E6/uL   Hemoglobin 14.6 13.0 - 17.7 g/dL   Hematocrit 43.9 37.5 - 51.0 %   MCV 90 79 - 97 fL   MCH 29.9 26.6 - 33.0 pg   MCHC 33.3 31.5 - 35.7 g/dL   RDW 12.0 11.6 - 15.4 %   Platelets 279 150 - 450 x10E3/uL   Neutrophils 51 Not Estab. %   Lymphs 36 Not Estab. %   Monocytes 9 Not Estab. %   Eos 3 Not Estab. %   Basos 1 Not Estab. %   Neutrophils Absolute 2.8 1.4 - 7.0 x10E3/uL   Lymphocytes Absolute 2.0 0.7 - 3.1 x10E3/uL   Monocytes Absolute 0.5 0.1 - 0.9 x10E3/uL   EOS (ABSOLUTE) 0.2 0.0 - 0.4 x10E3/uL   Basophils Absolute 0.0 0.0 - 0.2 x10E3/uL   Immature Granulocytes 0 Not Estab. %   Immature Grans (Abs) 0.0 0.0 - 0.1 x10E3/uL  Comprehensive metabolic panel  Result Value Ref Range   Glucose 96 65 - 99 mg/dL   BUN 16 8 - 27 mg/dL   Creatinine, Ser 0.86 0.76 - 1.27 mg/dL   eGFR 93 >59 mL/min/1.73   BUN/Creatinine Ratio 19 10 - 24   Sodium 139 134 - 144 mmol/L   Potassium 4.6 3.5 - 5.2  mmol/L   Chloride 102 96 - 106 mmol/L   CO2 23 20 - 29 mmol/L   Calcium 9.5 8.6 - 10.2 mg/dL   Total Protein 7.1 6.0 - 8.5 g/dL   Albumin 4.3 3.8 - 4.8 g/dL   Globulin, Total 2.8 1.5 - 4.5 g/dL   Albumin/Globulin Ratio 1.5 1.2 - 2.2   Bilirubin Total 0.4 0.0 - 1.2 mg/dL   Alkaline Phosphatase 159 (H) 44 - 121 IU/L   AST 20 0 - 40 IU/L   ALT 21 0 - 44 IU/L  TSH  Result Value Ref Range   TSH 0.839 0.450 - 4.500 uIU/mL  Lipid panel  Result Value Ref Range   Cholesterol, Total 253 (H) 100 - 199 mg/dL   Triglycerides 371 (H) 0 - 149 mg/dL   HDL 41 >39 mg/dL   VLDL Cholesterol Cal 68 (H) 5 - 40 mg/dL   LDL Chol Calc (NIH) 144 (H) 0 - 99 mg/dL   Chol/HDL Ratio 6.2 (H) 0.0 - 5.0  ratio  POCT urinalysis dipstick  Result Value Ref Range   Color, UA yellow    Clarity, UA clear    Glucose, UA Negative Negative   Bilirubin, UA negative    Ketones, UA negative    Spec Grav, UA 1.015 1.010 - 1.025   Blood, UA negative    pH, UA 6.0 5.0 - 8.0   Protein, UA Negative Negative   Urobilinogen, UA 0.2 0.2 or 1.0 E.U./dL   Nitrite, UA negative    Leukocytes, UA Negative Negative      Assessment & Plan:   Problem List Items Addressed This Visit       Cardiovascular and Mediastinum   Essential (primary) hypertension    Doing well on current regimen, no changes made today.      Relevant Orders   Comprehensive Metabolic Panel (CMET)     Digestive   Gastro-esophageal reflux disease without esophagitis    Doing well on current regimen, no changes made today.        Other   HLD (hyperlipidemia)    Recheck labs.      Relevant Orders   Comprehensive Metabolic Panel (CMET)   Other Visit Diagnoses     Annual physical exam    -  Primary   Relevant Orders   CBC   Hemoglobin A1c   Lipid panel   Comprehensive Metabolic Panel (CMET)   Thyroid disorder screen            LABORATORY TESTING:  Health maintenance labs ordered today as discussed above.   The natural history of  prostate cancer and ongoing controversy regarding screening and potential treatment outcomes of prostate cancer has been discussed with the patient. The meaning of a false positive PSA and a false negative PSA has been discussed. He indicates understanding of the limitations of this screening test and wishes not to proceed with screening PSA testing.   IMMUNIZATIONS:   - Tdap: Tetanus vaccination status reviewed: last tetanus booster within 10 years. - Influenza: Given elsewhere - Pneumococcal: Up to date - HPV: Not applicable - Shingrix vaccine: will check with pharmacy - COVID vaccine: has received 4 doses of mRNA vaccine  SCREENING: - Colonoscopy: Up to date  Discussed with patient purpose of the colonoscopy is to detect colon cancer at curable precancerous or early stages   - AAA Screening: CT A/P 02/2020 without AAA. - Lung cancer screening: n/a  Hep C Screening: UTD STD testing and prevention (HIV/chl/gon/syphilis): no concerns Sexual History: monogamous Incontinence Symptoms: none  PATIENT COUNSELING:    Advanced Care Planning: A voluntary discussion about advance care planning including the explanation and discussion of advance directives.  Discussed health care proxy and Living will, and the patient was able to identify a health care proxy as wife, Patrick Long.  Patient does have a living will at present time. If patient does have living will, I have requested they bring this to the clinic to be scanned in to their chart.  Sexuality: Discussed sexually transmitted diseases, partner selection, use of condoms, avoidance of unintended pregnancy  and contraceptive alternatives.   Advised to avoid cigarette smoking.  I discussed with the patient that most people either abstain from alcohol or drink within safe limits (<=14/week and <=4 drinks/occasion for males, <=7/weeks and <= 3 drinks/occasion for females) and that the risk for alcohol disorders and other health effects  rises proportionally with the number of drinks per week and how often a drinker exceeds daily limits.  Discussed cessation/primary  prevention of drug use and availability of treatment for abuse.   Diet: Encouraged to adjust caloric intake to maintain  or achieve ideal body weight, to reduce intake of dietary saturated fat and total fat, to limit sodium intake by avoiding high sodium foods and not adding table salt, and to maintain adequate dietary potassium and calcium preferably from fresh fruits, vegetables, and low-fat dairy products.    Stressed the importance of regular exercise  Injury prevention: Discussed safety belts, safety helmets, smoke detector, smoking near bedding or upholstery.   Dental health: Discussed importance of regular tooth brushing, flossing, and dental visits.   Follow up plan: NEXT PREVENTATIVE PHYSICAL DUE IN 1 YEAR. Return in about 1 year (around 03/02/2022) for cpe.

## 2021-03-02 NOTE — Assessment & Plan Note (Signed)
Doing well on current regimen, no changes made today. 

## 2021-03-02 NOTE — Patient Instructions (Addendum)
It was great to see you!  Our plans for today:  - Check with the pharmacy about your shingles vaccine.   We are checking some labs today, we will release these results to your MyChart.  Take care and seek immediate care sooner if you develop any concerns.   Dr. Ky Barban  Things to do to keep yourself healthy  - Exercise at least 30-45 minutes a day, 3-4 days a week.  - Eat a low-fat diet with lots of fruits and vegetables, up to 7-9 servings per day.  - Seatbelts can save your life. Wear them always.  - Smoke detectors on every level of your home, check batteries every year.  - Eye Doctor - have an eye exam every 1-2 years  - Safe sex - if you may be exposed to STDs, use a condom.  - Alcohol -  If you drink, do it moderately, less than 2 drinks per day.  - Eugenio Saenz. Choose someone to speak for you if you are not able. https://www.prepareforyourcare.org is a great website to help you navigate this. - Depression is common in our stressful world.If you're feeling down or losing interest in things you normally enjoy, please come in for a visit.  - Violence - If anyone is threatening or hurting you, please call immediately.

## 2021-03-02 NOTE — Assessment & Plan Note (Signed)
Recheck labs 

## 2021-03-03 LAB — CBC WITH DIFFERENTIAL/PLATELET
Basophils Absolute: 0 10*3/uL (ref 0.0–0.2)
Basos: 1 %
EOS (ABSOLUTE): 0.1 10*3/uL (ref 0.0–0.4)
Eos: 2 %
Hematocrit: 43.4 % (ref 37.5–51.0)
Hemoglobin: 14.6 g/dL (ref 13.0–17.7)
Immature Grans (Abs): 0 10*3/uL (ref 0.0–0.1)
Immature Granulocytes: 0 %
Lymphocytes Absolute: 1.9 10*3/uL (ref 0.7–3.1)
Lymphs: 34 %
MCH: 30.2 pg (ref 26.6–33.0)
MCHC: 33.6 g/dL (ref 31.5–35.7)
MCV: 90 fL (ref 79–97)
Monocytes Absolute: 0.4 10*3/uL (ref 0.1–0.9)
Monocytes: 7 %
Neutrophils Absolute: 3.1 10*3/uL (ref 1.4–7.0)
Neutrophils: 56 %
Platelets: 275 10*3/uL (ref 150–450)
RBC: 4.84 x10E6/uL (ref 4.14–5.80)
RDW: 12.4 % (ref 11.6–15.4)
WBC: 5.6 10*3/uL (ref 3.4–10.8)

## 2021-03-03 LAB — LIPID PANEL
Chol/HDL Ratio: 5.4 ratio — ABNORMAL HIGH (ref 0.0–5.0)
Cholesterol, Total: 237 mg/dL — ABNORMAL HIGH (ref 100–199)
HDL: 44 mg/dL (ref >39)
LDL Chol Calc (NIH): 142 mg/dL — ABNORMAL HIGH (ref 0–99)
Triglycerides: 283 mg/dL — ABNORMAL HIGH (ref 0–149)
VLDL Cholesterol Cal: 51 mg/dL — ABNORMAL HIGH (ref 5–40)

## 2021-03-03 LAB — COMPREHENSIVE METABOLIC PANEL
ALT: 19 IU/L (ref 0–44)
AST: 18 IU/L (ref 0–40)
Albumin/Globulin Ratio: 1.6 (ref 1.2–2.2)
Albumin: 4.1 g/dL (ref 3.7–4.7)
Alkaline Phosphatase: 122 IU/L — ABNORMAL HIGH (ref 44–121)
BUN/Creatinine Ratio: 17 (ref 10–24)
BUN: 17 mg/dL (ref 8–27)
Bilirubin Total: 0.3 mg/dL (ref 0.0–1.2)
CO2: 24 mmol/L (ref 20–29)
Calcium: 10 mg/dL (ref 8.6–10.2)
Chloride: 106 mmol/L (ref 96–106)
Creatinine, Ser: 1 mg/dL (ref 0.76–1.27)
Globulin, Total: 2.6 g/dL (ref 1.5–4.5)
Glucose: 98 mg/dL (ref 70–99)
Potassium: 5 mmol/L (ref 3.5–5.2)
Sodium: 142 mmol/L (ref 134–144)
Total Protein: 6.7 g/dL (ref 6.0–8.5)
eGFR: 80 mL/min/{1.73_m2} (ref >59)

## 2021-03-03 LAB — HEMOGLOBIN A1C
Est. average glucose Bld gHb Est-mCnc: 123 mg/dL
Hgb A1c MFr Bld: 5.9 % — ABNORMAL HIGH (ref 4.8–5.6)

## 2021-05-03 DIAGNOSIS — D2271 Melanocytic nevi of right lower limb, including hip: Secondary | ICD-10-CM | POA: Diagnosis not present

## 2021-05-03 DIAGNOSIS — D2261 Melanocytic nevi of right upper limb, including shoulder: Secondary | ICD-10-CM | POA: Diagnosis not present

## 2021-05-03 DIAGNOSIS — L570 Actinic keratosis: Secondary | ICD-10-CM | POA: Diagnosis not present

## 2021-05-03 DIAGNOSIS — D0439 Carcinoma in situ of skin of other parts of face: Secondary | ICD-10-CM | POA: Diagnosis not present

## 2021-05-03 DIAGNOSIS — D2262 Melanocytic nevi of left upper limb, including shoulder: Secondary | ICD-10-CM | POA: Diagnosis not present

## 2021-05-03 DIAGNOSIS — L821 Other seborrheic keratosis: Secondary | ICD-10-CM | POA: Diagnosis not present

## 2021-05-03 DIAGNOSIS — X32XXXA Exposure to sunlight, initial encounter: Secondary | ICD-10-CM | POA: Diagnosis not present

## 2021-05-03 DIAGNOSIS — D485 Neoplasm of uncertain behavior of skin: Secondary | ICD-10-CM | POA: Diagnosis not present

## 2021-05-03 DIAGNOSIS — Z85828 Personal history of other malignant neoplasm of skin: Secondary | ICD-10-CM | POA: Diagnosis not present

## 2021-05-16 ENCOUNTER — Telehealth: Payer: Self-pay | Admitting: Family Medicine

## 2021-05-16 ENCOUNTER — Other Ambulatory Visit: Payer: Self-pay | Admitting: Family Medicine

## 2021-05-16 DIAGNOSIS — I10 Essential (primary) hypertension: Secondary | ICD-10-CM

## 2021-05-16 NOTE — Telephone Encounter (Signed)
Copied from [facility] [phone]. Topic: Medicare AWV ?>> May 16, 2021 10:56 AM Cher Nakai R wrote: ?Reason for CRM:  ?Left message for patient to call back and schedule Medicare Annual Wellness Visit (AWV) in office.  ? ?If unable to come into the office for AWV,  please offer to do virtually or by telephone. ? ?Last AWV12/16/2021  ? ?Please schedule at anytime with [facility]. ? ?45 minute appointment for Virtual or phone ?45 minute appointment for in office or Initial virtual/phone ? ?Any questions, please contact me at [phone] ?

## 2021-05-18 ENCOUNTER — Ambulatory Visit (INDEPENDENT_AMBULATORY_CARE_PROVIDER_SITE_OTHER): Payer: Medicare Other

## 2021-05-18 VITALS — Wt 197.0 lb

## 2021-05-18 DIAGNOSIS — Z0000 Encounter for general adult medical examination without abnormal findings: Secondary | ICD-10-CM

## 2021-05-18 NOTE — Patient Instructions (Signed)
Patrick Long , ?Thank you for taking time to come for your Medicare Wellness Visit. I appreciate your ongoing commitment to your health goals. Please review the following plan we discussed and let me know if I can assist you in the future.  ? ?Screening recommendations/referrals: ?Colonoscopy: 11/11/15 ?Recommended yearly ophthalmology/optometry visit for glaucoma screening and checkup ?Recommended yearly dental visit for hygiene and checkup ? ?Vaccinations: ?Influenza vaccine: 01/13/21 ?Pneumococcal vaccine: 04/02/17 ?Tdap vaccine: 08/05/13 ?Shingles vaccine: Zostavax 08/05/13   ?Covid-19: 03/28/19, 04/25/19, 12/19/19, 01/13/21 ? ?Advanced directives: yes, copy requested ? ?Conditions/risks identified: none ? ?Next appointment: Follow up in one year for your annual wellness visit. - declined ? ?Preventive Care 72 Years and Older, Male ?Preventive care refers to lifestyle choices and visits with your health care provider that can promote health and wellness. ?What does preventive care include? ?A yearly physical exam. This is also called an annual well check. ?Dental exams once or twice a year. ?Routine eye exams. Ask your health care provider how often you should have your eyes checked. ?Personal lifestyle choices, including: ?Daily care of your teeth and gums. ?Regular physical activity. ?Eating a healthy diet. ?Avoiding tobacco and drug use. ?Limiting alcohol use. ?Practicing safe sex. ?Taking low doses of aspirin every day. ?Taking vitamin and mineral supplements as recommended by your health care provider. ?What happens during an annual well check? ?The services and screenings done by your health care provider during your annual well check will depend on your age, overall health, lifestyle risk factors, and family history of disease. ?Counseling  ?Your health care provider may ask you questions about your: ?Alcohol use. ?Tobacco use. ?Drug use. ?Emotional well-being. ?Home and relationship well-being. ?Sexual  activity. ?Eating habits. ?History of falls. ?Memory and ability to understand (cognition). ?Work and work Statistician. ?Screening  ?You may have the following tests or measurements: ?Height, weight, and BMI. ?Blood pressure. ?Lipid and cholesterol levels. These may be checked every 5 years, or more frequently if you are over 72 years old. ?Skin check. ?Lung cancer screening. You may have this screening every year starting at age 72 if you have a 30-pack-year history of smoking and currently smoke or have quit within the past 15 years. ?Fecal occult blood test (FOBT) of the stool. You may have this test every year starting at age 72. ?Flexible sigmoidoscopy or colonoscopy. You may have a sigmoidoscopy every 5 years or a colonoscopy every 10 years starting at age 72. ?Prostate cancer screening. Recommendations will vary depending on your family history and other risks. ?Hepatitis C blood test. ?Hepatitis B blood test. ?Sexually transmitted disease (STD) testing. ?Diabetes screening. This is done by checking your blood sugar (glucose) after you have not eaten for a while (fasting). You may have this done every 1-3 years. ?Abdominal aortic aneurysm (AAA) screening. You may need this if you are a current or former smoker. ?Osteoporosis. You may be screened starting at age 72 if you are at high risk. ?Talk with your health care provider about your test results, treatment options, and if necessary, the need for more tests. ?Vaccines  ?Your health care provider may recommend certain vaccines, such as: ?Influenza vaccine. This is recommended every year. ?Tetanus, diphtheria, and acellular pertussis (Tdap, Td) vaccine. You may need a Td booster every 10 years. ?Zoster vaccine. You may need this after age 72. ?Pneumococcal 13-valent conjugate (PCV13) vaccine. One dose is recommended after age 17. ?Pneumococcal polysaccharide (PPSV23) vaccine. One dose is recommended after age 72. ?Talk to your health care  provider about which  screenings and vaccines you need and how often you need them. ?This information is not intended to replace advice given to you by your health care provider. Make sure you discuss any questions you have with your health care provider. ?Document Released: 02/26/2015 Document Revised: 10/20/2015 Document Reviewed: 12/01/2014 ?Elsevier Interactive Patient Education ? 2017 [place]. ? ?Fall Prevention in the Home ?Falls can cause injuries. They can happen to people of all ages. There are many things you can do to make your home safe and to help prevent falls. ?What can I do on the outside of my home? ?Regularly fix the edges of walkways and driveways and fix any cracks. ?Remove anything that might make you trip as you walk through a door, such as a raised step or threshold. ?Trim any bushes or trees on the path to your home. ?Use bright outdoor lighting. ?Clear any walking paths of anything that might make someone trip, such as rocks or tools. ?Regularly check to see if handrails are loose or broken. Make sure that both sides of any steps have handrails. ?Any raised decks and porches should have guardrails on the edges. ?Have any leaves, snow, or ice cleared regularly. ?Use sand or salt on walking paths during winter. ?Clean up any spills in your garage right away. This includes oil or grease spills. ?What can I do in the bathroom? ?Use night lights. ?Install grab bars by the toilet and in the tub and shower. Do not use towel bars as grab bars. ?Use non-skid mats or decals in the tub or shower. ?If you need to sit down in the shower, use a plastic, non-slip stool. ?Keep the floor dry. Clean up any water that spills on the floor as soon as it happens. ?Remove soap buildup in the tub or shower regularly. ?Attach bath mats securely with double-sided non-slip rug tape. ?Do not have throw rugs and other things on the floor that can make you trip. ?What can I do in the bedroom? ?Use night lights. ?Make sure that you have a  light by your bed that is easy to reach. ?Do not use any sheets or blankets that are too big for your bed. They should not hang down onto the floor. ?Have a firm chair that has side arms. You can use this for support while you get dressed. ?Do not have throw rugs and other things on the floor that can make you trip. ?What can I do in the kitchen? ?Clean up any spills right away. ?Avoid walking on wet floors. ?Keep items that you use a lot in easy-to-reach places. ?If you need to reach something above you, use a strong step stool that has a grab bar. ?Keep electrical cords out of the way. ?Do not use floor polish or wax that makes floors slippery. If you must use wax, use non-skid floor wax. ?Do not have throw rugs and other things on the floor that can make you trip. ?What can I do with my stairs? ?Do not leave any items on the stairs. ?Make sure that there are handrails on both sides of the stairs and use them. Fix handrails that are broken or loose. Make sure that handrails are as long as the stairways. ?Check any carpeting to make sure that it is firmly attached to the stairs. Fix any carpet that is loose or worn. ?Avoid having throw rugs at the top or bottom of the stairs. If you do have throw rugs, attach them to the  floor with carpet tape. ?Make sure that you have a light switch at the top of the stairs and the bottom of the stairs. If you do not have them, ask someone to add them for you. ?What else can I do to help prevent falls? ?Wear shoes that: ?Do not have high heels. ?Have rubber bottoms. ?Are comfortable and fit you well. ?Are closed at the toe. Do not wear sandals. ?If you use a stepladder: ?Make sure that it is fully opened. Do not climb a closed stepladder. ?Make sure that both sides of the stepladder are locked into place. ?Ask someone to hold it for you, if possible. ?Clearly mark and make sure that you can see: ?Any grab bars or handrails. ?First and last steps. ?Where the edge of each step  is. ?Use tools that help you move around (mobility aids) if they are needed. These include: ?Canes. ?Walkers. ?Scooters. ?Crutches. ?Turn on the lights when you go into a dark area. Replace any light bulbs as soon

## 2021-05-18 NOTE — Progress Notes (Signed)
Virtual Visit via Telephone Note  I connected with  Patrick Long on 05/18/21 at  2:15 PM EDT by telephone and verified that I am speaking with the correct person using two identifiers.  Location: Patient: home Provider: [facility] Persons participating in the virtual visit: [place]   I discussed the limitations, risks, security and privacy concerns of performing an evaluation and management service by telephone and the availability of in person appointments. The patient expressed understanding and agreed to proceed.  Interactive audio and video telecommunications were attempted between this nurse and patient, however failed, due to patient having technical difficulties OR patient did not have access to video capability.  We continued and completed visit with audio only.  Some vital signs may be absent or patient reported.   Dionisio David, LPN  Subjective:   Patrick Long is a 71 y.o. male who presents for an Initial Medicare Annual Wellness Visit.  Review of Systems           Objective:    There were no vitals filed for this visit. There is no height or weight on file to calculate BMI.     03/02/2020    1:48 PM 11/11/2015    8:22 AM 09/22/2015    9:07 AM 11/03/2014    7:56 AM 08/26/2014    2:16 PM  Advanced Directives  Does Patient Have a Medical Advance Directive? No No No No No  Would patient like information on creating a medical advance directive? No - Patient declined Yes - Educational materials given       Current Medications (verified) Outpatient Encounter Medications as of 05/18/2021  Medication Sig   amLODipine (NORVASC) 5 MG tablet TAKE 1 TABLET(5 MG) BY MOUTH DAILY   aspirin 81 MG tablet Take 81 mg by mouth daily.   esomeprazole (NEXIUM) 40 MG capsule TAKE 1 CAPSULE BY MOUTH EVERY OTHER DAY AS DIRECTED   FLUARIX QUADRIVALENT 0.5 ML injection    loratadine (CLARITIN) 10 MG tablet Take 10 mg by mouth daily.   losartan (COZAAR) 100 MG tablet  TAKE 1 TABLET BY MOUTH EVERY DAY   metroNIDAZOLE (METROGEL) 1 % gel APPLY EXTERNALLY TO THE AFFECTED AREA DAILY   MODERNA COVID-19 BIVAL BOOSTER 50 MCG/0.5ML injection    Multiple Vitamin (MULTIVITAMIN WITH MINERALS) TABS tablet Take 1 tablet by mouth daily.   No facility-administered encounter medications on file as of 05/18/2021.    Allergies (verified) Fish allergy and Fish allergy   History: Past Medical History:  Diagnosis Date   GERD (gastroesophageal reflux disease)    Hypertension    Peripheral vascular disease ([place]) 2015   superficial blood clots - right lower leg   Past Surgical History:  Procedure Laterality Date   COLONOSCOPY WITH PROPOFOL N/A 11/11/2015   Procedure: COLONOSCOPY WITH PROPOFOL;  Surgeon: Lucilla Lame, MD;  Location: [place];  Service: Endoscopy;  Laterality: N/A;   ORIF TIBIA & FIBULA FRACTURES     POLYPECTOMY  11/11/2015   Procedure: POLYPECTOMY;  Surgeon: Lucilla Lame, MD;  Location: [place];  Service: Endoscopy;;   WISDOM TOOTH EXTRACTION     Family History  Problem Relation Age of Onset   Asthma Mother    Arrhythmia Mother    Dementia Mother    Prostate cancer Father    Stroke Father    Glaucoma Sister    Social History   Socioeconomic History   Marital status: Married    Spouse name: Dorinda   Number of children:  1   Years of education: 14   Highest education level: Not on file  Occupational History   Occupation: Physiological scientist: [employer]  Tobacco Use   Smoking status: Former    Years: 10.00    Types: Cigarettes    Quit date: 02/14/1980    Years since quitting: 41.2   Smokeless tobacco: Never  Substance and Sexual Activity   Alcohol use: Yes    Alcohol/week: 6.0 standard drinks    Types: 6 Cans of beer per week    Comment:      Drug use: No   Sexual activity: Yes  Other Topics Concern   Not on file  Social History Narrative   Not on file   Social Determinants of Health   Financial  Resource Strain: Not on file  Food Insecurity: Not on file  Transportation Needs: Not on file  Physical Activity: Not on file  Stress: Not on file  Social Connections: Not on file    Tobacco Counseling Counseling given: Not Answered   Clinical Intake:  Pre-visit preparation completed: Yes  Pain : No/denies pain     Nutritional Risks: None Diabetes: No  How often do you need to have someone help you when you read instructions, pamphlets, or other written materials from your doctor or pharmacy?: 1 - Never  Diabetic?no  Interpreter Needed?: No  Information entered by :: Kirke Shaggy, LPN   Activities of Daily Living    05/18/2021   11:26 AM 03/02/2021    8:19 AM  In your present state of health, do you have any difficulty performing the following activities:  Hearing? 0 0  Vision? 0 0  Difficulty concentrating or making decisions? 0 0  Walking or climbing stairs? 0 0  Dressing or bathing? 0 0  Doing errands, shopping? 0 0  Preparing Food and eating ? N   Using the Toilet? N   In the past six months, have you accidently leaked urine? N   Do you have problems with loss of bowel control? N   Managing your Medications? N   Managing your Finances? N   Housekeeping or managing your Housekeeping? N     Patient Care Team: Jerrol Banana., MD as PCP - General (Family Medicine) Jerrol Banana., MD (Family Medicine)  Indicate any recent Medical Services you may have received from other than [facility] providers in the past year (date may be approximate).     Assessment:   This is a routine wellness examination for [place].  Hearing/Vision screen No results found.  Dietary issues and exercise activities discussed:     Goals Addressed   None    Depression Screen    03/02/2021    8:19 AM 01/29/2020    2:28 PM 01/29/2019    2:15 PM 06/27/2018    9:04 AM 12/12/2017    2:19 PM 09/27/2016    4:58 PM 09/22/2015    9:12 AM  PHQ 2/9 Scores  PHQ - 2 Score 0  0 0 0 0 0 0  PHQ- 9 Score 0 0 0   0     Fall Risk    05/18/2021   11:26 AM 03/02/2021    8:18 AM 01/29/2020    2:26 PM 01/29/2019    2:16 PM 06/27/2018    9:04 AM  Fall Risk   Falls in the past year? 0 0 0 0 0  Number falls in past yr:  0 0  0   Injury with Fall?  0 0 0   Risk for fall due to :  No Fall Risks     Follow up  Falls evaluation completed Falls evaluation completed Falls evaluation completed     [place]:  Any stairs in or around the home? Yes  If so, are there any without handrails? No  Home free of loose throw rugs in walkways, pet beds, electrical cords, etc? Yes  Adequate lighting in your home to reduce risk of falls? Yes   ASSISTIVE DEVICES UTILIZED TO PREVENT FALLS:  Life alert? No  Use of a cane, walker or w/c? No  Grab bars in the bathroom? No  Shower chair or bench in shower? No  Elevated toilet seat or a handicapped toilet? Yes   Cognitive Function:       Immunizations Immunization History  Administered Date(s) Administered   Fluad Quad(high Dose 65+) 11/28/2018   Influenza, High Dose Seasonal PF 11/23/2017   Influenza, Seasonal, Injecte, Preservative Fre 11/23/2015   Influenza-Unspecified 01/13/2021   PFIZER(Purple Top)SARS-COV-2 Vaccination 03/28/2019, 04/25/2019, 12/19/2019   Pfizer Covid-19 Vaccine Bivalent Booster 2yr & up 01/13/2021   Pneumococcal Conjugate-13 09/22/2015   Pneumococcal Polysaccharide-23 04/02/2017   Td 11/03/2003   Tdap 08/05/2013   Zoster, Live 08/05/2013    TDAP status: Up to date  Flu Vaccine status: Up to date  Pneumococcal vaccine status: Up to date  Covid-19 vaccine status: Completed vaccines  Qualifies for Shingles Vaccine? Yes   Zostavax completed Yes   Shingrix Completed?: No.    Education has been provided regarding the importance of this vaccine. Patient has been advised to call insurance company to determine out of pocket expense if they have not yet received this  vaccine. Advised may also receive vaccine at local pharmacy or Health Dept. Verbalized acceptance and understanding.  Screening Tests Health Maintenance  Topic Date Due   Zoster Vaccines- Shingrix (1 of 2) Never done   INFLUENZA VACCINE  09/13/2021   TETANUS/TDAP  08/06/2023   COLONOSCOPY (Pts [phone] coverage will need to be confirmed)  11/10/2025   Pneumonia Vaccine 6550Years old  Completed   COVID-19 Vaccine  Completed   Hepatitis C Screening  Completed   HPV VACCINES  Aged Out    Health Maintenance  Health Maintenance Due  Topic Date Due   Zoster Vaccines- Shingrix (1 of 2) Never done    Colorectal cancer screening: Type of screening: Colonoscopy. Completed 11/11/15. Repeat every 10 years  Lung Cancer Screening: (Low Dose CT Chest recommended if Age 72-80ears, 30 pack-year currently smoking OR have quit w/in 15years.) does not qualify.   Additional Screening:  Hepatitis C Screening: does qualify; Completed 09/23/15  Vision Screening: Recommended annual ophthalmology exams for early detection of glaucoma and other disorders of the eye. Is the patient up to date with their annual eye exam?  Yes  Who is the provider or what is the name of the office in which the patient attends annual eye exams? [facility] with [facility] provider, would they like to be referred to a provider to establish care? No .   Dental Screening: Recommended annual dental exams for proper oral hygiene  Community Resource Referral / Chronic Care Management: CRR required this visit?  No   CCM required this visit?  No      Plan:     I have personally reviewed and noted the following in the  patient's chart:   Medical and social history Use of alcohol, tobacco or illicit drugs  Current medications and supplements including opioid prescriptions. Patient is not currently taking opioid prescriptions. Functional ability and status Nutritional status Physical  activity Advanced directives List of other physicians Hospitalizations, surgeries, and ER visits in previous 12 months Vitals Screenings to include cognitive, depression, and falls Referrals and appointments  In addition, I have reviewed and discussed with patient certain preventive protocols, quality metrics, and best practice recommendations. A written personalized care plan for preventive services as well as general preventive health recommendations were provided to patient.     Dionisio David, LPN   07/18/7844   Nurse Notes: none

## 2021-07-05 DIAGNOSIS — D0439 Carcinoma in situ of skin of other parts of face: Secondary | ICD-10-CM | POA: Diagnosis not present
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
## 2021-08-19 ENCOUNTER — Other Ambulatory Visit: Payer: Self-pay | Admitting: Family Medicine

## 2021-11-12 ENCOUNTER — Other Ambulatory Visit: Payer: Self-pay | Admitting: Family Medicine

## 2021-11-12 DIAGNOSIS — I10 Essential (primary) hypertension: Secondary | ICD-10-CM

## 2021-11-14 NOTE — Telephone Encounter (Signed)
Requested medications are due for refill today.  yes  Requested medications are on the active medications list.  yes  Last refill. Losartan 11/17/2020 #90 3 rf, Amlodipine 05/17/2021 #90 1 rf.  Future visit scheduled.   Yes - with Dr. Rosanna Randy  Notes to clinic.  Dr. Rosanna Randy pt. Called pt [facility] for appt.     Requested Prescriptions  Pending Prescriptions Disp Refills   losartan (COZAAR) 100 MG tablet [Pharmacy Med Name: LOSARTAN '100MG'$  TABLETS] 90 tablet 3    Sig: TAKE 1 TABLET BY MOUTH EVERY DAY     Cardiovascular:  Angiotensin Receptor Blockers Failed - 11/12/2021  6:33 AM      Failed - Cr in normal range and within 180 days    Creatinine, Ser  Date Value Ref Range Status  03/02/2021 1.00 0.76 - 1.27 mg/dL Final         Failed - K in normal range and within 180 days    Potassium  Date Value Ref Range Status  03/02/2021 5.0 3.5 - 5.2 mmol/L Final         Failed - Valid encounter within last 6 months    Recent Outpatient Visits           8 months ago Annual physical exam   [facility] Myles Gip, DO   1 year ago Acute bilateral low back pain with right-sided sciatica   [facility] Birdie Sons, MD   1 year ago Essential (primary) hypertension   [facility] Jerrol Banana., MD   1 year ago [place] Jerrol Banana., MD   1 year ago Annual physical exam   [facility] Jerrol Banana., MD              Passed - Patient is not pregnant      Passed - Last BP in normal range    BP Readings from Last 1 Encounters:  03/02/21 126/75          amLODipine (NORVASC) 5 MG tablet [Pharmacy Med Name: AMLODIPINE BESYLATE '5MG'$  TABLETS] 90 tablet 1    Sig: TAKE 1 TABLET(5 MG) BY MOUTH DAILY     Cardiovascular: Calcium Channel Blockers 2 Failed - 11/12/2021  6:33 AM      Failed - Valid encounter within last 6 months    Recent Outpatient Visits           8 months  ago Annual physical exam   [facility] Myles Gip, DO   1 year ago Acute bilateral low back pain with right-sided sciatica   [facility] Birdie Sons, MD   1 year ago Essential (primary) hypertension   [facility] Jerrol Banana., MD   1 year ago [place] Jerrol Banana., MD   1 year ago Annual physical exam   [facility] Jerrol Banana., MD              Passed - Last BP in normal range    BP Readings from Last 1 Encounters:  03/02/21 126/75         Passed - Last Heart Rate in normal range    Pulse Readings from Last 1 Encounters:  03/02/21 76

## 2021-11-14 NOTE — Telephone Encounter (Signed)
Called pt - [facility] for appt.  

## 2021-12-12 ENCOUNTER — Encounter (INDEPENDENT_AMBULATORY_CARE_PROVIDER_SITE_OTHER): Payer: Self-pay

## 2022-01-26 DIAGNOSIS — R208 Other disturbances of skin sensation: Secondary | ICD-10-CM | POA: Diagnosis not present

## 2022-01-26 DIAGNOSIS — C44329 Squamous cell carcinoma of skin of other parts of face: Secondary | ICD-10-CM | POA: Diagnosis not present

## 2022-01-26 DIAGNOSIS — D485 Neoplasm of uncertain behavior of skin: Secondary | ICD-10-CM | POA: Diagnosis not present

## 2022-02-22 DIAGNOSIS — H2513 Age-related nuclear cataract, bilateral: Secondary | ICD-10-CM | POA: Diagnosis not present

## 2022-03-06 ENCOUNTER — Encounter: Payer: Medicare Other | Admitting: Family Medicine

## 2022-03-06 ENCOUNTER — Ambulatory Visit: Payer: Medicare Other | Admitting: Family Medicine

## 2022-03-08 DIAGNOSIS — H2512 Age-related nuclear cataract, left eye: Secondary | ICD-10-CM | POA: Diagnosis not present

## 2022-03-08 DIAGNOSIS — H2511 Age-related nuclear cataract, right eye: Secondary | ICD-10-CM | POA: Diagnosis not present

## 2022-03-10 DIAGNOSIS — R739 Hyperglycemia, unspecified: Secondary | ICD-10-CM | POA: Diagnosis not present

## 2022-03-10 DIAGNOSIS — I82401 Acute embolism and thrombosis of unspecified deep veins of right lower extremity: Secondary | ICD-10-CM | POA: Diagnosis not present

## 2022-03-10 DIAGNOSIS — E782 Mixed hyperlipidemia: Secondary | ICD-10-CM | POA: Diagnosis not present

## 2022-03-10 DIAGNOSIS — Z0000 Encounter for general adult medical examination without abnormal findings: Secondary | ICD-10-CM | POA: Diagnosis not present

## 2022-03-10 DIAGNOSIS — I10 Essential (primary) hypertension: Secondary | ICD-10-CM | POA: Diagnosis not present

## 2022-03-10 DIAGNOSIS — M25512 Pain in left shoulder: Secondary | ICD-10-CM | POA: Diagnosis not present

## 2022-03-13 ENCOUNTER — Telehealth: Payer: Self-pay

## 2022-03-13 DIAGNOSIS — D0439 Carcinoma in situ of skin of other parts of face: Secondary | ICD-10-CM | POA: Diagnosis not present

## 2022-03-13 DIAGNOSIS — C44329 Squamous cell carcinoma of skin of other parts of face: Secondary | ICD-10-CM | POA: Diagnosis not present

## 2022-03-13 NOTE — Telephone Encounter (Signed)
Patient primary care is requesting Last colonoscopy and Path report fax to them. Printed and faxed to Dr. Rosanna Randy office at [facility] at [phone]

## 2022-03-16 ENCOUNTER — Encounter: Payer: Self-pay | Admitting: Ophthalmology

## 2022-03-22 NOTE — Discharge Instructions (Signed)

## 2022-03-27 ENCOUNTER — Encounter
Admission: RE | Disposition: A | Discharge status: Home / Self Care | Payer: Self-pay | Source: Home / Self Care | Attending: Ophthalmology

## 2022-03-27 ENCOUNTER — Other Ambulatory Visit: Payer: Self-pay

## 2022-03-27 ENCOUNTER — Ambulatory Visit
Admission: RE | Admit: 2022-03-27 | Discharge: 2022-03-27 | Disposition: A | Discharge status: Home / Self Care | Payer: Medicare Other | Attending: Ophthalmology | Admitting: Ophthalmology

## 2022-03-27 ENCOUNTER — Ambulatory Visit: Payer: Medicare Other | Admitting: Anesthesiology

## 2022-03-27 ENCOUNTER — Encounter: Payer: Self-pay | Admitting: Ophthalmology

## 2022-03-27 DIAGNOSIS — Z7901 Long term (current) use of anticoagulants: Secondary | ICD-10-CM | POA: Diagnosis not present

## 2022-03-27 DIAGNOSIS — Z87891 Personal history of nicotine dependence: Secondary | ICD-10-CM | POA: Insufficient documentation

## 2022-03-27 DIAGNOSIS — Z86718 Personal history of other venous thrombosis and embolism: Secondary | ICD-10-CM | POA: Diagnosis not present

## 2022-03-27 DIAGNOSIS — M1990 Unspecified osteoarthritis, unspecified site: Secondary | ICD-10-CM | POA: Insufficient documentation

## 2022-03-27 DIAGNOSIS — I739 Peripheral vascular disease, unspecified: Secondary | ICD-10-CM | POA: Insufficient documentation

## 2022-03-27 DIAGNOSIS — H2511 Age-related nuclear cataract, right eye: Secondary | ICD-10-CM | POA: Diagnosis not present

## 2022-03-27 DIAGNOSIS — I10 Essential (primary) hypertension: Secondary | ICD-10-CM | POA: Diagnosis not present

## 2022-03-27 DIAGNOSIS — K219 Gastro-esophageal reflux disease without esophagitis: Secondary | ICD-10-CM | POA: Diagnosis not present

## 2022-03-27 HISTORY — PX: CATARACT EXTRACTION W/PHACO: SHX586

## 2022-03-27 SURGERY — PHACOEMULSIFICATION, CATARACT, WITH IOL INSERTION
Anesthesia: Monitor Anesthesia Care | Site: Eye | Laterality: Right

## 2022-03-27 MED ORDER — ONDANSETRON HCL 4 MG/2ML IJ SOLN: 4.0000 mg | Freq: Once | INTRAMUSCULAR | Status: DC | PRN

## 2022-03-27 MED ORDER — SIGHTPATH DOSE#1 BSS IO SOLN
INTRAOCULAR | Status: DC | PRN
  Administered 2022-03-27: 87 mL via OPHTHALMIC

## 2022-03-27 MED ORDER — TETRACAINE HCL 0.5 % OP SOLN
1.0000 [drp] | OPHTHALMIC | Status: DC | PRN
  Administered 2022-03-27 (×3): 1 [drp] via OPHTHALMIC

## 2022-03-27 MED ORDER — LIDOCAINE HCL (PF) 2 % IJ SOLN
INTRAOCULAR | Status: DC | PRN
  Administered 2022-03-27: 1 mL via INTRAOCULAR

## 2022-03-27 MED ORDER — ARMC OPHTHALMIC DILATING DROPS
1.0000 | OPHTHALMIC | Status: DC | PRN
  Administered 2022-03-27 (×3): 1 via OPHTHALMIC

## 2022-03-27 MED ORDER — ACETAMINOPHEN 325 MG PO TABS: 650.0000 mg | ORAL_TABLET | Freq: Once | ORAL | Status: DC | PRN

## 2022-03-27 MED ORDER — ACETAMINOPHEN 160 MG/5ML PO SOLN: 325.0000 mg | ORAL | Status: DC | PRN

## 2022-03-27 MED ORDER — LACTATED RINGERS IV SOLN: INTRAVENOUS | Status: DC

## 2022-03-27 MED ORDER — FENTANYL CITRATE (PF) 100 MCG/2ML IJ SOLN
INTRAMUSCULAR | Status: DC | PRN
  Administered 2022-03-27: 50 ug via INTRAVENOUS

## 2022-03-27 MED ORDER — SIGHTPATH DOSE#1 BSS IO SOLN
INTRAOCULAR | Status: DC | PRN
  Administered 2022-03-27: 15 mL

## 2022-03-27 MED ORDER — MIDAZOLAM HCL 2 MG/2ML IJ SOLN
INTRAMUSCULAR | Status: DC | PRN
  Administered 2022-03-27: 2 mg via INTRAVENOUS

## 2022-03-27 MED ORDER — MOXIFLOXACIN HCL 0.5 % OP SOLN
OPHTHALMIC | Status: DC | PRN
  Administered 2022-03-27: .2 mL via OPHTHALMIC

## 2022-03-27 MED ORDER — SIGHTPATH DOSE#1 NA HYALUR & NA CHOND-NA HYALUR IO KIT
PACK | INTRAOCULAR | Status: DC | PRN
  Administered 2022-03-27: 1 via OPHTHALMIC

## 2022-03-27 SURGICAL SUPPLY — 21 items
CANNULA ANT/CHMB 27G (MISCELLANEOUS) IMPLANT
CANNULA ANT/CHMB 27GA (MISCELLANEOUS) IMPLANT
CATARACT SUITE SIGHTPATH (MISCELLANEOUS) ×1 IMPLANT
DISSECTOR HYDRO NUCLEUS 50X22 (MISCELLANEOUS) ×1 IMPLANT
FEE CATARACT SUITE SIGHTPATH (MISCELLANEOUS) ×1 IMPLANT
GLOVE SURG GAMMEX PI TX LF 7.5 (GLOVE) ×1 IMPLANT
GLOVE SURG SYN 8.5  E (GLOVE) ×1
GLOVE SURG SYN 8.5 E (GLOVE) ×1 IMPLANT
GLOVE SURG SYN 8.5 PF PI (GLOVE) ×1 IMPLANT
LENS CLAREON VIVITY TORIC 21.5 ×1 IMPLANT
LENS CLRN VIVITY TORIC 5 21.5 ×1 IMPLANT
LENS IOL CLRN VT TRC 5 21.5 IMPLANT
NDL FILTER BLUNT 18X1 1/2 (NEEDLE) ×1 IMPLANT
NEEDLE FILTER BLUNT 18X1 1/2 (NEEDLE) ×1 IMPLANT
PACK VIT ANT 23G (MISCELLANEOUS) IMPLANT
RING MALYGIN (MISCELLANEOUS) IMPLANT
SUT ETHILON 10-0 CS-B-6CS-B-6 (SUTURE)
SUTURE EHLN 10-0 CS-B-6CS-B-6 (SUTURE) IMPLANT
SYR 3ML LL SCALE MARK (SYRINGE) ×1 IMPLANT
SYR 5ML LL (SYRINGE) ×1 IMPLANT
WATER STERILE IRR 250ML POUR (IV SOLUTION) ×1 IMPLANT

## 2022-03-27 NOTE — Anesthesia Postprocedure Evaluation (Signed)
Anesthesia Post Note  Patient: Patrick Long  Procedure(s) Performed: CATARACT EXTRACTION PHACO AND INTRAOCULAR LENS PLACEMENT (IOC) RIGHT CLAREON VIVITY TORIC LENS (Right: Eye)  Patient location during evaluation: PACU Anesthesia Type: MAC Level of consciousness: awake and alert, oriented and patient cooperative Pain management: pain level controlled Vital Signs Assessment: post-procedure vital signs reviewed and stable Respiratory status: spontaneous breathing, nonlabored ventilation and respiratory function stable Cardiovascular status: blood pressure returned to baseline and stable Postop Assessment: adequate PO intake Anesthetic complications: no   No notable events documented.   Last Vitals:  Vitals:   03/27/22 1212 03/27/22 1217  BP: 99/74 102/86  Pulse: 63 60  Resp: 14 17  Temp: 36.4 C 36.4 C  SpO2: 94% 96%    Last Pain:  Vitals:   03/27/22 1217  TempSrc:   PainSc: 0-No pain                 Darrin Nipper

## 2022-03-27 NOTE — Transfer of Care (Signed)
Immediate Anesthesia Transfer of Care Note  Patient: Patrick Long  Procedure(s) Performed: CATARACT EXTRACTION PHACO AND INTRAOCULAR LENS PLACEMENT (IOC) RIGHT CLAREON [place] (Right: Eye)  Patient Location: PACU  Anesthesia Type: MAC  Level of Consciousness: awake, alert  and patient cooperative  Airway and Oxygen Therapy: Patient Spontanous Breathing and Patient connected to supplemental oxygen  Post-op Assessment: Post-op Vital signs reviewed, Patient's Cardiovascular Status Stable, Respiratory Function Stable, Patent Airway and No signs of Nausea or vomiting  Post-op Vital Signs: Reviewed and stable  Complications: No notable events documented.

## 2022-03-27 NOTE — Anesthesia Preprocedure Evaluation (Addendum)
Anesthesia Evaluation  Patient identified by MRN, date of birth, ID band Patient awake    Reviewed: Allergy & Precautions, NPO status , Patient's Chart, lab work & pertinent test results  History of Anesthesia Complications Negative for: history of anesthetic complications  Airway Mallampati: I   Neck ROM: Full    Dental  (+) Missing   Pulmonary former smoker (quit 1982)   Pulmonary exam normal breath sounds clear to auscultation       Cardiovascular hypertension, + Peripheral Vascular Disease  Normal cardiovascular exam Rhythm:Regular Rate:Normal  Hx DVT   Neuro/Psych negative neurological ROS     GI/Hepatic ,GERD  ,,  Endo/Other  negative endocrine ROS    Renal/GU negative Renal ROS     Musculoskeletal  (+) Arthritis ,    Abdominal   Peds  Hematology negative hematology ROS (+)   Anesthesia Other Findings   Reproductive/Obstetrics                             Anesthesia Physical Anesthesia Plan  ASA: 2  Anesthesia Plan: MAC   Post-op Pain Management:    Induction: Intravenous  PONV Risk Score and Plan: 1 and Treatment may vary due to age or medical condition, Midazolam and TIVA  Airway Management Planned: Natural Airway and Nasal Cannula  Additional Equipment:   Intra-op Plan:   Post-operative Plan:   Informed Consent: I have reviewed the patients History and Physical, chart, labs and discussed the procedure including the risks, benefits and alternatives for the proposed anesthesia with the patient or authorized representative who has indicated his/her understanding and acceptance.     Dental advisory given  Plan Discussed with: CRNA  Anesthesia Plan Comments: (LMA/GETA backup discussed.  Patient consented for risks of anesthesia including but not limited to:  - adverse reactions to medications - damage to eyes, teeth, lips or other oral mucosa - nerve damage due  to positioning  - sore throat or hoarseness - damage to heart, brain, nerves, lungs, other parts of body or loss of life  Informed patient about role of CRNA in peri- and intra-operative care.  Patient voiced understanding.)       Anesthesia Quick Evaluation

## 2022-03-27 NOTE — H&P (Signed)
[facility]   Primary Care Physician:  Eulas Post, MD Ophthalmologist: Dr. Benay Pillow  Pre-Procedure History & Physical: HPI:  Patrick Long is a 72 y.o. male here for cataract surgery.   Past Medical History:  Diagnosis Date   DVT, lower extremity ([place]) 2015   GERD (gastroesophageal reflux disease)    Hypertension    Peripheral vascular disease ([place]) 2015   superficial blood clots - right lower leg    Past Surgical History:  Procedure Laterality Date   COLONOSCOPY WITH PROPOFOL N/A 11/11/2015   Procedure: COLONOSCOPY WITH PROPOFOL;  Surgeon: Lucilla Lame, MD;  Location: [place];  Service: Endoscopy;  Laterality: N/A;   ORIF TIBIA & FIBULA FRACTURES     POLYPECTOMY  11/11/2015   Procedure: POLYPECTOMY;  Surgeon: Lucilla Lame, MD;  Location: [place];  Service: Endoscopy;;   WISDOM TOOTH EXTRACTION      Prior to Admission medications   Medication Sig Start Date End Date Taking? Authorizing Provider  amLODipine (NORVASC) 5 MG tablet TAKE 1 TABLET(5 MG) BY MOUTH DAILY 11/14/21  Yes Myles Gip, DO  aspirin 81 MG tablet Take 81 mg by mouth daily.   Yes [provider]  esomeprazole (NEXIUM) 40 MG capsule TAKE ONE CAPSULE BY MOUTH BY MOUTH EVERY OTHER DAY AS DIRECTED 08/19/21  Yes Eulas Post, MD  loratadine (CLARITIN) 10 MG tablet Take 10 mg by mouth daily.   Yes [provider]  losartan (COZAAR) 100 MG tablet TAKE 1 TABLET BY MOUTH EVERY DAY 11/14/21  Yes Rumball, Jake Church, DO  Multiple Vitamin (MULTIVITAMIN WITH MINERALS) TABS tablet Take 1 tablet by mouth daily.   Yes [provider]  FLUARIX QUADRIVALENT 0.5 ML injection  01/12/21   [provider]  metroNIDAZOLE (METROGEL) 1 % gel APPLY EXTERNALLY TO THE AFFECTED AREA DAILY 03/11/19   [provider]  MODERNA COVID-19 BIVAL BOOSTER 50 MCG/0.5ML injection  01/12/21   [provider]    Allergies as of 02/24/2022 - Review  Complete 05/18/2021  Allergen Reaction Noted   Fish allergy Nausea And Vomiting 11/03/2014   Fish allergy Swelling 03/02/2020    Family History  Problem Relation Age of Onset   Asthma Mother    Arrhythmia Mother    Dementia Mother    Prostate cancer Father    Stroke Father    Glaucoma Sister     Social History   Socioeconomic History   Marital status: Married    Spouse name: Dorinda   Number of children: 1   Years of education: 31   Highest education level: Not on file  Occupational History   Occupation: Physiological scientist: [employer]  Tobacco Use   Smoking status: Former    Years: 10.00    Types: Cigarettes    Quit date: 02/14/1980    Years since quitting: 42.1   Smokeless tobacco: Never  Vaping Use   Vaping Use: Never used  Substance and Sexual Activity   Alcohol use: Yes    Alcohol/week: 6.0 standard drinks of alcohol    Types: 6 Cans of beer per week    Comment:      Drug use: No   Sexual activity: Yes  Other Topics Concern   Not on file  Social History Narrative   Not on file   Social Determinants of Health   Financial Resource Strain: Low Risk  (05/18/2021)   Overall Financial Resource Strain (CARDIA)  Difficulty of Paying Living Expenses: Not hard at all  Food Insecurity: No Food Insecurity (05/18/2021)   Hunger Vital Sign    Worried About Running Out of Food in the Last Year: Never true    Ran Out of Food in the Last Year: Never true  Transportation Needs: No Transportation Needs (05/18/2021)   PRAPARE - Hydrologist (Medical): No    Lack of Transportation (Non-Medical): No  Physical Activity: Insufficiently Active (05/18/2021)   Exercise Vital Sign    Days of Exercise per Week: 3 days    Minutes of Exercise per Session: 30 min  Stress: No Stress Concern Present (05/18/2021)   [place]    Feeling of Stress : Not at all  Social Connections:  Moderately Integrated (05/18/2021)   Social Connection and Isolation Panel [NHANES]    Frequency of Communication with Friends and Family: Once a week    Frequency of Social Gatherings with Friends and Family: Twice a week    Attends Religious Services: More than 4 times per year    Active Member of [employer] or Organizations: No    Attends Archivist Meetings: Never    Marital Status: Married  Human resources officer Violence: Not At Risk (05/18/2021)   Humiliation, Afraid, Rape, and Kick questionnaire    Fear of Current or Ex-Partner: No    Emotionally Abused: No    Physically Abused: No    Sexually Abused: No    Review of Systems: See HPI, otherwise negative ROS  Physical Exam: BP (!) 149/96   Temp 98.9 F (37.2 C) (Tympanic)   Resp 14   Ht 6' 0.01" (1.829 m)   Wt 88.4 kg   SpO2 98%   BMI 26.43 kg/m  General:   Alert, cooperative in NAD Head:  Normocephalic and atraumatic. Respiratory:  Normal work of breathing. Cardiovascular:  RRR  Impression/Plan: Patrick Long is here for cataract surgery.  Risks, benefits, limitations, and alternatives regarding cataract surgery have been reviewed with the patient.  Questions have been answered.  All parties agreeable.   Benay Pillow, MD  03/27/2022, 11:42 AM

## 2022-03-27 NOTE — Op Note (Signed)
OPERATIVE NOTE  Patrick Long [id number] 03/27/2022   PREOPERATIVE DIAGNOSIS:  Nuclear sclerotic cataract right eye.  H25.11   POSTOPERATIVE DIAGNOSIS:    Nuclear sclerotic cataract right eye.     PROCEDURE:  Phacoemusification with posterior chamber intraocular lens placement of the right eye   LENS:   Implant Name Type Inv. Item Serial No. Manufacturer Lot No. LRB No. Used Action  ALCON CLAREON VIVITY TORIC IOL Intraocular Lens  [id number] ALCON  Right 1 Implanted       Procedure(s) with comments: CATARACT EXTRACTION PHACO AND INTRAOCULAR LENS PLACEMENT (IOC) RIGHT CLAREON VIVITY TORIC LENS (Right) - 3.23 0:24.9  CNWET5 +21.5 @ 007   ULTRASOUND TIME: 0 minutes 24 seconds.  CDE 3.23   SURGEON:  Benay Pillow, MD, MPH  ANESTHESIOLOGIST: Anesthesiologist: Darrin Nipper, MD CRNA: Moises Blood, CRNA   ANESTHESIA:  Topical with tetracaine drops augmented with 1% preservative-free intracameral lidocaine.  ESTIMATED BLOOD LOSS: less than 1 mL.   COMPLICATIONS:  None.   DESCRIPTION OF PROCEDURE:  The patient was identified in the holding room and transported to the operating room and placed in the supine position under the operating microscope.  The right eye was identified as the operative eye and it was prepped and draped in the usual sterile ophthalmic fashion.  The verion system was registered without difficulty.   A 1.0 millimeter clear-corneal paracentesis was made at the 10:30 position. 0.5 ml of preservative-free 1% lidocaine with epinephrine was injected into the anterior chamber.  The anterior chamber was filled with viscoelastic.  A 2.4 millimeter keratome was used to make a near-clear corneal incision at the 8:00 position.  A curvilinear capsulorrhexis was made with a cystotome and capsulorrhexis forceps.  Balanced salt solution was used to hydrodissect and hydrodelineate the nucleus.   Phacoemulsification was then used in stop and chop fashion to remove the lens  nucleus and epinucleus.  The remaining cortex was then removed using the irrigation and aspiration handpiece. Viscoelastic was then placed into the capsular bag to distend it for lens placement.  A lens was then injected into the capsular bag.  The remaining viscoelastic was aspirated.  The lens was rotated with guidance from the [place] system.   Wounds were hydrated with balanced salt solution.  The anterior chamber was inflated to a physiologic pressure with balanced salt solution. The lens was well centered.  Intracameral vigamox 0.1 mL undiluted was injected into the eye and a drop placed onto the ocular surface.  No wound leaks were noted.  The patient was taken to the recovery room in stable condition without complications of anesthesia or surgery  Benay Pillow 03/27/2022, 12:11 PM

## 2022-03-28 ENCOUNTER — Encounter: Payer: Self-pay | Admitting: Ophthalmology

## 2022-03-29 ENCOUNTER — Encounter: Payer: Self-pay | Admitting: Ophthalmology

## 2022-04-05 DIAGNOSIS — H2512 Age-related nuclear cataract, left eye: Secondary | ICD-10-CM | POA: Diagnosis not present

## 2022-04-06 NOTE — Discharge Instructions (Signed)

## 2022-04-10 ENCOUNTER — Encounter
Admission: RE | Disposition: A | Discharge status: Home / Self Care | Payer: Self-pay | Source: Home / Self Care | Attending: Ophthalmology

## 2022-04-10 ENCOUNTER — Ambulatory Visit
Admission: RE | Admit: 2022-04-10 | Discharge: 2022-04-10 | Disposition: A | Discharge status: Home / Self Care | Payer: Medicare Other | Attending: Ophthalmology | Admitting: Ophthalmology

## 2022-04-10 ENCOUNTER — Other Ambulatory Visit: Payer: Self-pay

## 2022-04-10 ENCOUNTER — Ambulatory Visit: Payer: Medicare Other | Admitting: Anesthesiology

## 2022-04-10 ENCOUNTER — Encounter: Payer: Self-pay | Admitting: Ophthalmology

## 2022-04-10 DIAGNOSIS — H2512 Age-related nuclear cataract, left eye: Secondary | ICD-10-CM | POA: Insufficient documentation

## 2022-04-10 DIAGNOSIS — Z87891 Personal history of nicotine dependence: Secondary | ICD-10-CM | POA: Diagnosis not present

## 2022-04-10 DIAGNOSIS — E785 Hyperlipidemia, unspecified: Secondary | ICD-10-CM | POA: Diagnosis not present

## 2022-04-10 DIAGNOSIS — I10 Essential (primary) hypertension: Secondary | ICD-10-CM | POA: Diagnosis not present

## 2022-04-10 DIAGNOSIS — E1136 Type 2 diabetes mellitus with diabetic cataract: Secondary | ICD-10-CM | POA: Diagnosis not present

## 2022-04-10 HISTORY — PX: CATARACT EXTRACTION W/PHACO: SHX586

## 2022-04-10 SURGERY — PHACOEMULSIFICATION, CATARACT, WITH IOL INSERTION
Anesthesia: Topical | Site: Eye | Laterality: Left

## 2022-04-10 MED ORDER — SIGHTPATH DOSE#1 NA HYALUR & NA CHOND-NA HYALUR IO KIT
PACK | INTRAOCULAR | Status: DC | PRN
  Administered 2022-04-10: 1 via OPHTHALMIC

## 2022-04-10 MED ORDER — ARMC OPHTHALMIC DILATING DROPS
1.0000 | OPHTHALMIC | Status: DC | PRN
  Administered 2022-04-10 (×3): 1 via OPHTHALMIC

## 2022-04-10 MED ORDER — SIGHTPATH DOSE#1 BSS IO SOLN
INTRAOCULAR | Status: DC | PRN
  Administered 2022-04-10: 98 mL via OPHTHALMIC

## 2022-04-10 MED ORDER — LIDOCAINE HCL (PF) 2 % IJ SOLN
INTRAOCULAR | Status: DC | PRN
  Administered 2022-04-10: 1 mL via INTRAOCULAR

## 2022-04-10 MED ORDER — FENTANYL CITRATE (PF) 100 MCG/2ML IJ SOLN
INTRAMUSCULAR | Status: DC | PRN
  Administered 2022-04-10 (×2): 50 ug via INTRAVENOUS

## 2022-04-10 MED ORDER — TETRACAINE HCL 0.5 % OP SOLN
1.0000 [drp] | OPHTHALMIC | Status: DC | PRN
  Administered 2022-04-10 (×3): 1 [drp] via OPHTHALMIC

## 2022-04-10 MED ORDER — MOXIFLOXACIN HCL 0.5 % OP SOLN
OPHTHALMIC | Status: DC | PRN
  Administered 2022-04-10: .2 mL via OPHTHALMIC

## 2022-04-10 MED ORDER — MIDAZOLAM HCL 2 MG/2ML IJ SOLN
INTRAMUSCULAR | Status: DC | PRN
  Administered 2022-04-10: 2 mg via INTRAVENOUS

## 2022-04-10 MED ORDER — SIGHTPATH DOSE#1 BSS IO SOLN
INTRAOCULAR | Status: DC | PRN
  Administered 2022-04-10: 15 mL

## 2022-04-10 SURGICAL SUPPLY — 15 items
CATARACT SUITE SIGHTPATH (MISCELLANEOUS) ×1 IMPLANT
DISSECTOR HYDRO NUCLEUS 50X22 (MISCELLANEOUS) ×1 IMPLANT
FEE CATARACT SUITE SIGHTPATH (MISCELLANEOUS) ×1 IMPLANT
GLOVE SURG GAMMEX PI TX LF 7.5 (GLOVE) ×1 IMPLANT
GLOVE SURG SYN 8.5  E (GLOVE) ×1
GLOVE SURG SYN 8.5 E (GLOVE) ×1 IMPLANT
GLOVE SURG SYN 8.5 PF PI (GLOVE) ×1 IMPLANT
LENS CLAREON VIVITY TORIC 21.5 ×1 IMPLANT
LENS CLRN VIVITY TORIC 5 21.5 ×1 IMPLANT
LENS IOL CLRN VT TRC 5 21.5 IMPLANT
NDL FILTER BLUNT 18X1 1/2 (NEEDLE) ×1 IMPLANT
NEEDLE FILTER BLUNT 18X1 1/2 (NEEDLE) ×1 IMPLANT
SYR 3ML LL SCALE MARK (SYRINGE) ×1 IMPLANT
SYR 5ML LL (SYRINGE) ×1 IMPLANT
WATER STERILE IRR 250ML POUR (IV SOLUTION) ×1 IMPLANT

## 2022-04-10 NOTE — Transfer of Care (Signed)
Immediate Anesthesia Transfer of Care Note  Patient: Patrick Long  Procedure(s) Performed: CATARACT EXTRACTION PHACO AND INTRAOCULAR LENS PLACEMENT (IOC) LEFT  CLAREON VIVITY TORIC LENS  2.90  00:23.4 (Left: Eye)  Patient Location: PACU  Anesthesia Type: No value filed.  Level of Consciousness: awake, alert  and patient cooperative  Airway and Oxygen Therapy: Patient Spontanous Breathing and Patient connected to supplemental oxygen  Post-op Assessment: Post-op Vital signs reviewed, Patient's Cardiovascular Status Stable, Respiratory Function Stable, Patent Airway and No signs of Nausea or vomiting  Post-op Vital Signs: Reviewed and stable  Complications: No notable events documented.

## 2022-04-10 NOTE — H&P (Signed)
[facility]   Primary Care Physician:  Eulas Post, MD Ophthalmologist: Dr. Benay Pillow  Pre-Procedure History & Physical: HPI:  Patrick Long is a 72 y.o. male here for cataract surgery.   Past Medical History:  Diagnosis Date   DVT, lower extremity ([place]) 2015   GERD (gastroesophageal reflux disease)    Hypertension    Peripheral vascular disease ([place]) 2015   superficial blood clots - right lower leg    Past Surgical History:  Procedure Laterality Date   CATARACT EXTRACTION W/PHACO Right 03/27/2022   Procedure: CATARACT EXTRACTION PHACO AND INTRAOCULAR LENS PLACEMENT ([place]) RIGHT CLAREON [place];  Surgeon: Eulogio Bear, MD;  Location: [place];  Service: Ophthalmology;  Laterality: Right;  3.23 0:24.9   COLONOSCOPY WITH PROPOFOL N/A 11/11/2015   Procedure: COLONOSCOPY WITH PROPOFOL;  Surgeon: Lucilla Lame, MD;  Location: [place];  Service: Endoscopy;  Laterality: N/A;   ORIF TIBIA & FIBULA FRACTURES     POLYPECTOMY  11/11/2015   Procedure: POLYPECTOMY;  Surgeon: Lucilla Lame, MD;  Location: [place];  Service: Endoscopy;;   WISDOM TOOTH EXTRACTION      Prior to Admission medications   Medication Sig Start Date End Date Taking? Authorizing Provider  amLODipine (NORVASC) 5 MG tablet TAKE 1 TABLET(5 MG) BY MOUTH DAILY 11/14/21  Yes Myles Gip, DO  aspirin 81 MG tablet Take 81 mg by mouth daily.   Yes [provider]  esomeprazole (NEXIUM) 40 MG capsule TAKE ONE CAPSULE BY MOUTH BY MOUTH EVERY OTHER DAY AS DIRECTED 08/19/21  Yes Eulas Post, MD  loratadine (CLARITIN) 10 MG tablet Take 10 mg by mouth daily.   Yes [provider]  losartan (COZAAR) 100 MG tablet TAKE 1 TABLET BY MOUTH EVERY DAY 11/14/21  Yes Rumball, Jake Church, DO  metroNIDAZOLE (METROGEL) 1 % gel APPLY EXTERNALLY TO THE AFFECTED AREA DAILY 03/11/19  Yes [provider]  MODERNA COVID-19 BIVAL BOOSTER 50 MCG/0.5ML  injection  01/12/21  Yes [provider]  Multiple Vitamin (MULTIVITAMIN WITH MINERALS) TABS tablet Take 1 tablet by mouth daily.   Yes [provider]  FLUARIX QUADRIVALENT 0.5 ML injection  01/12/21   [provider]    Allergies as of 02/24/2022 - Review Complete 05/18/2021  Allergen Reaction Noted   Fish allergy Nausea And Vomiting 11/03/2014   Fish allergy Swelling 03/02/2020    Family History  Problem Relation Age of Onset   Asthma Mother    Arrhythmia Mother    Dementia Mother    Prostate cancer Father    Stroke Father    Glaucoma Sister     Social History   Socioeconomic History   Marital status: Married    Spouse name: Dorinda   Number of children: 1   Years of education: 14   Highest education level: Not on file  Occupational History   Occupation: Physiological scientist: [name]  Tobacco Use   Smoking status: Former    Years: 10.00    Types: Cigarettes    Quit date: 02/14/1980    Years since quitting: 42.1   Smokeless tobacco: Never  Vaping Use   Vaping Use: Never used  Substance and Sexual Activity   Alcohol use: Yes    Alcohol/week: 6.0 standard drinks of alcohol    Types: 6 Cans of beer per week    Comment:      Drug use: No   Sexual activity:  Yes  Other Topics Concern   Not on file  Social History Narrative   Not on file   Social Determinants of Health   Financial Resource Strain: Low Risk  (05/18/2021)   Overall Financial Resource Strain (CARDIA)    Difficulty of Paying Living Expenses: Not hard at all  Food Insecurity: No Food Insecurity (05/18/2021)   Hunger Vital Sign    Worried About Running Out of Food in the Last Year: Never true    Ran Out of Food in the Last Year: Never true  Transportation Needs: No Transportation Needs (05/18/2021)   PRAPARE - Hydrologist (Medical): No    Lack of Transportation (Non-Medical): No  Physical Activity: Insufficiently Active (05/18/2021)    Exercise Vital Sign    Days of Exercise per Week: 3 days    Minutes of Exercise per Session: 30 min  Stress: No Stress Concern Present (05/18/2021)   [place]    Feeling of Stress : Not at all  Social Connections: Moderately Integrated (05/18/2021)   Social Connection and Isolation Panel [NHANES]    Frequency of Communication with Friends and Family: Once a week    Frequency of Social Gatherings with Friends and Family: Twice a week    Attends Religious Services: More than 4 times per year    Active Member of [employer] or Organizations: No    Attends Archivist Meetings: Never    Marital Status: Married  Human resources officer Violence: Not At Risk (05/18/2021)   Humiliation, Afraid, Rape, and Kick questionnaire    Fear of Current or Ex-Partner: No    Emotionally Abused: No    Physically Abused: No    Sexually Abused: No    Review of Systems: See HPI, otherwise negative ROS  Physical Exam: BP (!) 154/93   Pulse 63   Temp 98.5 F (36.9 C) (Temporal)   Resp 17   Ht 6' (1.829 m)   Wt 88.8 kg   SpO2 100%   BMI 26.54 kg/m  General:   Alert, cooperative in NAD Head:  Normocephalic and atraumatic. Respiratory:  Normal work of breathing. Cardiovascular:  RRR  Impression/Plan: Patrick Long is here for cataract surgery.  Risks, benefits, limitations, and alternatives regarding cataract surgery have been reviewed with the patient.  Questions have been answered.  All parties agreeable.   Benay Pillow, MD  04/10/2022, 10:40 AM

## 2022-04-10 NOTE — Anesthesia Postprocedure Evaluation (Signed)
Anesthesia Post Note  Patient: Patrick Long  Procedure(s) Performed: CATARACT EXTRACTION PHACO AND INTRAOCULAR LENS PLACEMENT (IOC) LEFT  CLAREON VIVITY TORIC LENS  2.90  00:23.4 (Left: Eye)  Patient location during evaluation: PACU Anesthesia Type: MAC Level of consciousness: awake and alert Pain management: pain level controlled Vital Signs Assessment: post-procedure vital signs reviewed and stable Respiratory status: spontaneous breathing, nonlabored ventilation, respiratory function stable and patient connected to nasal cannula oxygen Cardiovascular status: stable and blood pressure returned to baseline Postop Assessment: no apparent nausea or vomiting Anesthetic complications: no   No notable events documented.   Last Vitals:  Vitals:   04/10/22 1111 04/10/22 1116  BP: (!) 110/90 (!) 131/94  Pulse: 66 68  Resp: 18 13  Temp: (!) 36.4 C   SpO2: 97% 97%    Last Pain:  Vitals:   04/10/22 0952  TempSrc: Temporal  PainSc: 0-No pain                 Molli Barrows

## 2022-04-10 NOTE — Op Note (Signed)
OPERATIVE NOTE  Patrick Long [id number] 04/10/2022   PREOPERATIVE DIAGNOSIS:  Nuclear sclerotic cataract left eye.  H25.12   POSTOPERATIVE DIAGNOSIS:    Nuclear sclerotic cataract left eye.     PROCEDURE:  Phacoemusification with posterior chamber intraocular lens placement of the left eye   LENS:   Implant Name Type Inv. Item Serial No. Manufacturer Lot No. LRB No. Used Action  LENS CLAREON VIVITY TORIC 21.5 - [id number]  LENS CLAREON VIVITY TORIC 21.5 [id number] [facility]  Left 1 Wasted  LENS CLAREON VIVITY TORIC 21.5 - [id number]  LENS CLAREON VIVITY TORIC 21.5 [id number] [employer]  Left 1 Implanted      Procedure(s): CATARACT EXTRACTION PHACO AND INTRAOCULAR LENS PLACEMENT (IOC) LEFT  CLAREON VIVITY TORIC LENS  2.90  00:23.4 (Left)  CNWET5 +21.5   ULTRASOUND TIME: 0 minutes 23 seconds.  CDE 2.90   SURGEON:  Benay Pillow, MD, MPH   ANESTHESIA:  Topical with tetracaine drops augmented with 1% preservative-free intracameral lidocaine.  ESTIMATED BLOOD LOSS: <1 mL   COMPLICATIONS:  None.   DESCRIPTION OF PROCEDURE:  The patient was identified in the holding room and transported to the operating room and placed in the supine position under the operating microscope.  The left eye was identified as the operative eye and it was prepped and draped in the usual sterile ophthalmic fashion.  The verion system was registered without difficulty.   A 1.0 millimeter clear-corneal paracentesis was made at the 5:00 position. 0.5 ml of preservative-free 1% lidocaine with epinephrine was injected into the anterior chamber.  The anterior chamber was filled with viscoelastic.  A 2.4 millimeter keratome was used to make a near-clear corneal incision at the 2:00 position.  A curvilinear capsulorrhexis was made with a cystotome and capsulorrhexis forceps.  Balanced salt solution was used to hydrodissect and hydrodelineate the nucleus.   Phacoemulsification was then used in stop and chop  fashion to remove the lens nucleus and epinucleus.  The remaining cortex was then removed using the irrigation and aspiration handpiece. Viscoelastic was then placed into the capsular bag to distend it for lens placement.  A lens was then injected into the capsular bag.  The remaining viscoelastic was aspirated.  The lens was rotated with guidance from the [place] system.   Wounds were hydrated with balanced salt solution.  The anterior chamber was inflated to a physiologic pressure with balanced salt solution.  Intracameral vigamox 0.1 mL undiltued was injected into the eye and a drop placed onto the ocular surface.  No wound leaks were noted.  The patient was taken to the recovery room in stable condition without complications of anesthesia or surgery  Benay Pillow 04/10/2022, 11:09 AM

## 2022-04-10 NOTE — Anesthesia Preprocedure Evaluation (Signed)
Anesthesia Evaluation  Patient identified by MRN, date of birth, ID band Patient awake    Reviewed: Allergy & Precautions, H&P , NPO status , Patient's Chart, lab work & pertinent test results, reviewed documented beta blocker date and time   Airway Mallampati: II  TM Distance: >3 FB Neck ROM: full    Dental no notable dental hx. (+) Teeth Intact   Pulmonary neg pulmonary ROS, former smoker   Pulmonary exam normal breath sounds clear to auscultation       Cardiovascular Exercise Tolerance: Good hypertension, On Medications negative cardio ROS  Rhythm:regular Rate:Normal     Neuro/Psych negative neurological ROS  negative psych ROS   GI/Hepatic Neg liver ROS,GERD  Medicated,,  Endo/Other  diabetes, Well Controlled Hyperthyroidism   Renal/GU      Musculoskeletal   Abdominal   Peds  Hematology negative hematology ROS (+)   Anesthesia Other Findings   Reproductive/Obstetrics negative OB ROS                             Anesthesia Physical Anesthesia Plan  ASA: 3  Anesthesia Plan: MAC   Post-op Pain Management:    Induction:   PONV Risk Score and Plan:   Airway Management Planned:   Additional Equipment:   Intra-op Plan:   Post-operative Plan:   Informed Consent: I have reviewed the patients History and Physical, chart, labs and discussed the procedure including the risks, benefits and alternatives for the proposed anesthesia with the patient or authorized representative who has indicated his/her understanding and acceptance.       Plan Discussed with: CRNA  Anesthesia Plan Comments:        Anesthesia Quick Evaluation

## 2022-04-11 ENCOUNTER — Encounter: Payer: Self-pay | Admitting: Ophthalmology

## 2022-05-11 ENCOUNTER — Other Ambulatory Visit: Payer: Self-pay | Admitting: Family Medicine

## 2022-05-11 DIAGNOSIS — I10 Essential (primary) hypertension: Secondary | ICD-10-CM

## 2022-05-11 NOTE — Telephone Encounter (Signed)
Losartan and amlodipine refill requests were denied because he is seeing Dr. Rosanna Randy at the [facility].
